# Patient Record
Sex: Female | Born: 1990 | Race: White | Hispanic: No | Marital: Single | Smoking: Former smoker
Health system: Southern US, Community
[De-identification: ages and names within clinical notes are randomized; demographics above are authoritative.]

## PROBLEM LIST (undated history)

## (undated) ENCOUNTER — Inpatient Hospital Stay (HOSPITAL_COMMUNITY): Payer: Self-pay

## (undated) DIAGNOSIS — F32A Depression, unspecified: Secondary | ICD-10-CM

## (undated) DIAGNOSIS — F419 Anxiety disorder, unspecified: Secondary | ICD-10-CM

## (undated) DIAGNOSIS — M549 Dorsalgia, unspecified: Secondary | ICD-10-CM

## (undated) DIAGNOSIS — R0602 Shortness of breath: Secondary | ICD-10-CM

## (undated) DIAGNOSIS — M2550 Pain in unspecified joint: Secondary | ICD-10-CM

## (undated) DIAGNOSIS — E282 Polycystic ovarian syndrome: Secondary | ICD-10-CM

## (undated) DIAGNOSIS — I10 Essential (primary) hypertension: Secondary | ICD-10-CM

## (undated) HISTORY — DX: Anxiety disorder, unspecified: F41.9

## (undated) HISTORY — DX: Pain in unspecified joint: M25.50

## (undated) HISTORY — DX: Dorsalgia, unspecified: M54.9

## (undated) HISTORY — DX: Polycystic ovarian syndrome: E28.2

## (undated) HISTORY — DX: Shortness of breath: R06.02

## (undated) HISTORY — DX: Essential (primary) hypertension: I10

## (undated) HISTORY — DX: Depression, unspecified: F32.A

---
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
## 2010-11-04 ENCOUNTER — Emergency Department (HOSPITAL_COMMUNITY)
Admission: EM | Admit: 2010-11-04 | Discharge: 2010-11-05 | Disposition: A | Discharge status: Home / Self Care | Payer: No Typology Code available for payment source | Attending: Emergency Medicine | Admitting: Emergency Medicine

## 2010-11-04 DIAGNOSIS — T1490XA Injury, unspecified, initial encounter: Secondary | ICD-10-CM | POA: Insufficient documentation

## 2010-11-04 DIAGNOSIS — M549 Dorsalgia, unspecified: Secondary | ICD-10-CM | POA: Insufficient documentation

## 2010-11-04 DIAGNOSIS — Y92410 Unspecified street and highway as the place of occurrence of the external cause: Secondary | ICD-10-CM | POA: Insufficient documentation

## 2011-02-18 ENCOUNTER — Ambulatory Visit: Payer: Self-pay

## 2011-02-18 DIAGNOSIS — J020 Streptococcal pharyngitis: Secondary | ICD-10-CM

## 2012-11-06 ENCOUNTER — Ambulatory Visit: Payer: Self-pay | Admitting: Emergency Medicine

## 2012-11-06 ENCOUNTER — Ambulatory Visit: Payer: Self-pay

## 2012-11-06 VITALS — BP 128/86 | HR 72 | Temp 98.1°F | Resp 18 | Ht 67.0 in | Wt 226.4 lb

## 2012-11-06 DIAGNOSIS — M25512 Pain in left shoulder: Secondary | ICD-10-CM

## 2012-11-06 DIAGNOSIS — M25519 Pain in unspecified shoulder: Secondary | ICD-10-CM

## 2012-11-06 MED ORDER — MELOXICAM 15 MG PO TABS: 15.0000 mg | ORAL_TABLET | Freq: Every day | ORAL | Status: DC

## 2012-11-06 MED ORDER — CYCLOBENZAPRINE HCL 5 MG PO TABS: 5.0000 mg | ORAL_TABLET | Freq: Three times a day (TID) | ORAL | Status: DC | PRN

## 2012-11-06 MED ORDER — TRAMADOL HCL 50 MG PO TABS: 50.0000 mg | ORAL_TABLET | Freq: Three times a day (TID) | ORAL | Status: DC | PRN

## 2012-11-06 NOTE — Progress Notes (Signed)
  Subjective:    Patient ID: Tami Swanson, female    DOB: 05/06/1990, 22 y.o.   MRN: [id number]  HPI  22 y.o. Female student  presents to clinic with right shoulder pain. Pain started yesterday morning. Denies any trouble with neck. Has had some tingling in arm and numbness every so often. Took ibuprofen for symptoms.  Notices pain when bending neck down to chest. Has some trouble when lifting arms. Has pain when turning neck to the left. LMP : 2 weeks ago  Review of Systems     Objective:   Physical Exam there is tenderness in the left parascapular area. There is pain with rotation and extension of the neck. Motor strength is 5 out of 5 all muscle groups. Rotator cuff testing is normal.  [facility] reading (PRIMARY) by  Dr.  Cleta Alberts C-spine shows straightening of the cervical spine shoulder films are normal        Assessment & Plan:  I think his symptoms are coming from her neck. We'll treat with meloxicam, Flexeril, and Ultram if needed at night.

## 2016-06-15 ENCOUNTER — Ambulatory Visit (INDEPENDENT_AMBULATORY_CARE_PROVIDER_SITE_OTHER): Payer: 59 | Admitting: Physician Assistant

## 2016-06-15 ENCOUNTER — Ambulatory Visit (INDEPENDENT_AMBULATORY_CARE_PROVIDER_SITE_OTHER): Payer: 59

## 2016-06-15 ENCOUNTER — Encounter: Payer: Self-pay | Admitting: Physician Assistant

## 2016-06-15 VITALS — BP 128/77 | HR 100 | Temp 97.6°F | Resp 17 | Ht 66.5 in | Wt 224.4 lb

## 2016-06-15 DIAGNOSIS — M545 Low back pain, unspecified: Secondary | ICD-10-CM

## 2016-06-15 DIAGNOSIS — M25511 Pain in right shoulder: Secondary | ICD-10-CM

## 2016-06-15 DIAGNOSIS — M25562 Pain in left knee: Secondary | ICD-10-CM | POA: Diagnosis not present

## 2016-06-15 DIAGNOSIS — Z87828 Personal history of other (healed) physical injury and trauma: Secondary | ICD-10-CM

## 2016-06-15 DIAGNOSIS — M25512 Pain in left shoulder: Secondary | ICD-10-CM

## 2016-06-15 DIAGNOSIS — M25561 Pain in right knee: Secondary | ICD-10-CM

## 2016-06-15 MED ORDER — CYCLOBENZAPRINE HCL 5 MG PO TABS: 5.0000 mg | ORAL_TABLET | Freq: Three times a day (TID) | ORAL | 1 refills | Status: DC | PRN

## 2016-06-15 MED ORDER — MELOXICAM 7.5 MG PO TABS: 7.5000 mg | ORAL_TABLET | Freq: Every day | ORAL | 0 refills | Status: DC

## 2016-06-15 MED ORDER — TRAMADOL HCL 50 MG PO TABS: 50.0000 mg | ORAL_TABLET | Freq: Three times a day (TID) | ORAL | 0 refills | Status: DC | PRN
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
## 2016-06-15 NOTE — Patient Instructions (Addendum)
Stay well hydrated.  Stay moving - try to walk daily. Keep moving!  Use heat and/or ice as needed for your back.  See below for stretches. - perform exercises below at 5 sets with 10 repetitions. Stretches are to be performed for 5 sets, 10 seconds each. perform this rehab twice daily within pain tolerance for 2 weeks.  Ultram is a controlled substance. Use this at night if you are having trouble sleeping.  Come back in 2-3 weeks if no improvement.    Low Back Strain Rehab Ask your health care provider which exercises are safe for you. Do exercises exactly as told by your health care provider and adjust them as directed. It is normal to feel mild stretching, pulling, tightness, or discomfort as you do these exercises, but you should stop right away if you feel sudden pain or your pain gets worse. Do not begin these exercises until told by your health care provider. Stretching and range of motion exercises These exercises warm up your muscles and joints and improve the movement and flexibility of your back. These exercises also help to relieve pain, numbness, and tingling. Exercise A: Single knee to chest   1. Lie on your back on a firm surface with both legs straight. 2. Bend one of your knees. Use your hands to move your knee up toward your chest until you feel a gentle stretch in your lower back and buttock.  Hold your leg in this position by holding onto the front of your knee.  Keep your other leg as straight as possible. 3. Hold for __________ seconds. 4. Slowly return to the starting position. 5. Repeat with your other leg. Repeat __________ times. Complete this exercise __________ times a day. Exercise B: Prone extension on elbows   1. Lie on your abdomen on a firm surface. 2. Prop yourself up on your elbows. 3. Use your arms to help lift your chest up until you feel a gentle stretch in your abdomen and your lower back.  This will place some of your body weight on your elbows.  If this is uncomfortable, try stacking pillows under your chest.  Your hips should stay down, against the surface that you are lying on. Keep your hip and back muscles relaxed. 4. Hold for __________ seconds. 5. Slowly relax your upper body and return to the starting position. Repeat __________ times. Complete this exercise __________ times a day. Strengthening exercises These exercises build strength and endurance in your back. Endurance is the ability to use your muscles for a long time, even after they get tired. Exercise C: Pelvic tilt  1. Lie on your back on a firm surface. Bend your knees and keep your feet flat. 2. Tense your abdominal muscles. Tip your pelvis up toward the ceiling and flatten your lower back into the floor.  To help with this exercise, you may place a small towel under your lower back and try to push your back into the towel. 3. Hold for __________ seconds. 4. Let your muscles relax completely before you repeat this exercise. Repeat __________ times. Complete this exercise __________ times a day. Exercise D: Alternating arm and leg raises   1. Get on your hands and knees on a firm surface. If you are on a hard floor, you may want to use padding to cushion your knees, such as an exercise mat. 2. Line up your arms and legs. Your hands should be below your shoulders, and your knees should be below your hips.  3. Lift your left leg behind you. At the same time, raise your right arm and straighten it in front of you.  Do not lift your leg higher than your hip.  Do not lift your arm higher than your shoulder.  Keep your abdominal and back muscles tight.  Keep your hips facing the ground.  Do not arch your back.  Keep your balance carefully, and do not hold your breath. 4. Hold for __________ seconds. 5. Slowly return to the starting position and repeat with your right leg and your left arm. Repeat __________ times. Complete this exercise __________times a  day. Exercise J: Single leg lower with bent knees  1. Lie on your back on a firm surface. 2. Tense your abdominal muscles and lift your feet off the floor, one foot at a time, so your knees and hips are bent in an "L" shape (at about 90 degrees).  Your knees should be over your hips and your lower legs should be parallel to the floor. 3. Keeping your abdominal muscles tense and your knee bent, slowly lower one of your legs so your toe touches the ground. 4. Lift your leg back up to return to the starting position.  Do not hold your breath.  Do not let your back arch. Keep your back flat against the ground. 5. Repeat with your other leg. Repeat __________ times. Complete this exercise __________ times a day. Posture and body mechanics   Body mechanics refers to the movements and positions of your body while you do your daily activities. Posture is part of body mechanics. Good posture and healthy body mechanics can help to relieve stress in your body's tissues and joints. Good posture means that your spine is in its natural S-curve position (your spine is neutral), your shoulders are pulled back slightly, and your head is not tipped forward. The following are general guidelines for applying improved posture and body mechanics to your everyday activities. Standing    When standing, keep your spine neutral and your feet about hip-width apart. Keep a slight bend in your knees. Your ears, shoulders, and hips should line up.  When you do a task in which you stand in one place for a long time, place one foot up on a stable object that is 2-4 inches (5-10 cm) high, such as a footstool. This helps keep your spine neutral. Sitting    When sitting, keep your spine neutral and keep your feet flat on the floor. Use a footrest, if necessary, and keep your thighs parallel to the floor. Avoid rounding your shoulders, and avoid tilting your head forward.  When working at a desk or a computer, keep your  desk at a height where your hands are slightly lower than your elbows. Slide your chair under your desk so you are close enough to maintain good posture.  When working at a computer, place your monitor at a height where you are looking straight ahead and you do not have to tilt your head forward or downward to look at the screen. Resting    When lying down and resting, avoid positions that are most painful for you.  If you have pain with activities such as sitting, bending, stooping, or squatting (flexion-based activities), lie in a position in which your body does not bend very much. For example, avoid curling up on your side with your arms and knees near your chest (fetal position).  If you have pain with activities such as standing for a  long time or reaching with your arms (extension-based activities), lie with your spine in a neutral position and bend your knees slightly. Try the following positions:  Lying on your side with a pillow between your knees.  Lying on your back with a pillow under your knees. Lifting    When lifting objects, keep your feet at least shoulder-width apart and tighten your abdominal muscles.  Bend your knees and hips and keep your spine neutral. It is important to lift using the strength of your legs, not your back. Do not lock your knees straight out.  Always ask for help to lift heavy or awkward objects. This information is not intended to replace advice given to you by your health care provider. Make sure you discuss any questions you have with your health care provider. Document Released: 02/11/2005 Document Revised: 10/19/2015 Document Reviewed: 11/23/2014 Elsevier Interactive Patient Education  2017 [employer].  IF you received an x-ray today, you will receive an invoice from [facility]. Please contact [facility] at [phone] with questions or concerns regarding your invoice.   IF you received labwork today, you will receive  an invoice from [facility]. Please contact [facility] at [phone] with questions or concerns regarding your invoice.   Our billing staff will not be able to assist you with questions regarding bills from these companies.  You will be contacted with the lab results as soon as they are available. The fastest way to get your results is to activate your My Chart account. Instructions are located on the last page of this paperwork. If you have not heard from [place] regarding the results in 2 weeks, please contact this office.

## 2017-06-18 ENCOUNTER — Encounter: Payer: Self-pay | Admitting: Physician Assistant

## 2017-06-18 ENCOUNTER — Ambulatory Visit (INDEPENDENT_AMBULATORY_CARE_PROVIDER_SITE_OTHER): Payer: 59 | Admitting: Physician Assistant

## 2017-06-18 ENCOUNTER — Other Ambulatory Visit: Payer: Self-pay

## 2017-06-18 VITALS — BP 124/76 | HR 75 | Temp 98.8°F | Resp 18 | Ht 66.5 in | Wt 234.2 lb

## 2017-06-18 DIAGNOSIS — Z130 Encounter for screening for diseases of the blood and blood-forming organs and certain disorders involving the immune mechanism: Secondary | ICD-10-CM

## 2017-06-18 DIAGNOSIS — Z1321 Encounter for screening for nutritional disorder: Secondary | ICD-10-CM

## 2017-06-18 DIAGNOSIS — Z114 Encounter for screening for human immunodeficiency virus [HIV]: Secondary | ICD-10-CM | POA: Diagnosis not present

## 2017-06-18 DIAGNOSIS — Z1329 Encounter for screening for other suspected endocrine disorder: Secondary | ICD-10-CM | POA: Diagnosis not present

## 2017-06-18 DIAGNOSIS — Z0000 Encounter for general adult medical examination without abnormal findings: Secondary | ICD-10-CM | POA: Diagnosis not present

## 2017-06-18 DIAGNOSIS — Z113 Encounter for screening for infections with a predominantly sexual mode of transmission: Secondary | ICD-10-CM | POA: Diagnosis not present

## 2017-06-18 DIAGNOSIS — Z13228 Encounter for screening for other metabolic disorders: Secondary | ICD-10-CM

## 2017-06-18 DIAGNOSIS — Z111 Encounter for screening for respiratory tuberculosis: Secondary | ICD-10-CM

## 2017-06-18 DIAGNOSIS — Z23 Encounter for immunization: Secondary | ICD-10-CM

## 2017-06-18 NOTE — Progress Notes (Signed)
06/18/2017 2:08 PM   DOB: Jul 18, 1990 / MRN: [id number]  SUBJECTIVE:  Tami MaineRachel Gierke is a 27 y.o. female presenting for annual exam.  She is a smoker. Had a pap last year and this followed by Gyn. She has no complaints today and feels well.  She would like me to sign some school forms for her.  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  No significant family history of CAD, cancer, stroke.   She has No Known Allergies.   She  has a past medical history of Hypertension.    She  reports that she has been smoking cigarettes.  She has been smoking about 0.25 packs per day. She has never used smokeless tobacco. She reports that she drinks alcohol. She reports that she does not use drugs. She  has no sexual activity history on file. The patient  has a past surgical history that includes Foot surgery (Right, 2006).  Her family history includes Heart disease in her maternal grandfather; Hypertension in her father.  Review of Systems  Constitutional: Negative for chills, diaphoresis and fever.  Eyes: Negative.   Respiratory: Negative for cough, hemoptysis, sputum production, shortness of breath and wheezing.   Cardiovascular: Negative for chest pain, orthopnea and leg swelling.  Gastrointestinal: Negative for abdominal pain, blood in stool, constipation, diarrhea, heartburn, melena, nausea and vomiting.  Genitourinary: Negative for dysuria, flank pain, frequency, hematuria and urgency.  Skin: Negative for rash.  Neurological: Negative for dizziness, sensory change, speech change, focal weakness and headaches.    The problem list and medications were reviewed and updated by myself where necessary and exist elsewhere in the encounter.   OBJECTIVE:  BP 124/76   Pulse 75   Temp 98.8 F (37.1 C) (Oral)   Resp 18   Ht 5' 6.5" (1.689 m)   Wt 234 lb 3.2 oz (106.2 kg)   LMP 06/02/2017   SpO2 97%   BMI 37.23 kg/m   Wt Readings from Last 3 Encounters:  06/18/17 234 lb 3.2 oz (106.2 kg)  06/15/16 224 lb 6 oz  (101.8 kg)  11/06/12 226 lb 6.4 oz (102.7 kg)   Physical Exam  Constitutional: She is oriented to person, place, and time. She appears well-nourished. No distress.  Eyes: Pupils are equal, round, and reactive to light. EOM are normal.  Cardiovascular: Normal rate, regular rhythm, S1 normal, S2 normal, normal heart sounds and intact distal pulses. Exam reveals no gallop, no friction rub and no decreased pulses.  No murmur heard. Pulmonary/Chest: Effort normal. No stridor. No respiratory distress. She has no wheezes. She has no rales.  Abdominal: Soft. Bowel sounds are normal. She exhibits no distension and no mass. There is no tenderness. There is no rebound and no guarding. No hernia.  Musculoskeletal: She exhibits no edema.  Neurological: She is alert and oriented to person, place, and time. No cranial nerve deficit. Gait normal.  Skin: Skin is dry. She is not diaphoretic.  Psychiatric: She has a normal mood and affect.  Vitals reviewed.   No results found for this or any previous visit (from the past 72 hour(s)).  No results found.  ASSESSMENT AND PLAN:  Fleet ContrasRachel was seen today for annual exam.  Diagnoses and all orders for this visit:  Annual physical exam  Screening for endocrine, nutritional, metabolic and immunity disorder -     CBC -     Hemoglobin A1c -     Basic metabolic panel -     Hepatic function  panel -     Lipid panel -     TSH -     Iron, TIBC and Ferritin Panel -     Urinalysis, dipstick only -     Varicella Zoster Abs, IgG/IgM  Screening for HIV (human immunodeficiency virus) -     HIV antibody  Screening examination for sexually transmitted disease -     GC/Chlamydia Probe Amp  Tuberculosis screening -     TB Skin Test -     Care order/instruction:  Need for vaccination for meningococcus -     MENINGOCOCCAL MCV4O    The patient is advised to call or return to clinic if she does not see an improvement in symptoms, or to seek the care of the  closest emergency department if she worsens with the above plan.   Deliah Boston, MHS, PA-C Primary Care at [facility] 06/18/2017 2:08 PM

## 2017-06-19 DIAGNOSIS — F17200 Nicotine dependence, unspecified, uncomplicated: Secondary | ICD-10-CM | POA: Insufficient documentation

## 2017-06-19 LAB — BASIC METABOLIC PANEL
BUN/Creatinine Ratio: 7 — ABNORMAL LOW (ref 9–23)
BUN: 5 mg/dL — ABNORMAL LOW (ref 6–20)
CALCIUM: 9.6 mg/dL (ref 8.7–10.2)
CHLORIDE: 101 mmol/L (ref 96–106)
CO2: 21 mmol/L (ref 20–29)
Creatinine, Ser: 0.69 mg/dL (ref 0.57–1.00)
GFR calc Af Amer: 137 mL/min/{1.73_m2} (ref >59)
GFR, EST NON AFRICAN AMERICAN: 119 mL/min/{1.73_m2} (ref >59)
GLUCOSE: 82 mg/dL (ref 65–99)
POTASSIUM: 4.4 mmol/L (ref 3.5–5.2)
SODIUM: 136 mmol/L (ref 134–144)

## 2017-06-19 LAB — TSH: TSH: 2.23 u[IU]/mL (ref 0.450–4.500)

## 2017-06-19 LAB — IRON,TIBC AND FERRITIN PANEL
Ferritin: 30 ng/mL (ref 15–150)
IRON SATURATION: 29 % (ref 15–55)
IRON: 85 ug/dL (ref 27–159)
Total Iron Binding Capacity: 295 ug/dL (ref 250–450)
UIBC: 210 ug/dL (ref 131–425)

## 2017-06-19 LAB — CBC
HEMOGLOBIN: 13.9 g/dL (ref 11.1–15.9)
Hematocrit: 40.1 % (ref 34.0–46.6)
MCH: 29.8 pg (ref 26.6–33.0)
MCHC: 34.7 g/dL (ref 31.5–35.7)
MCV: 86 fL (ref 79–97)
PLATELETS: 335 10*3/uL (ref 150–379)
RBC: 4.66 x10E6/uL (ref 3.77–5.28)
RDW: 13.6 % (ref 12.3–15.4)
WBC: 8 10*3/uL (ref 3.4–10.8)

## 2017-06-19 LAB — HEPATIC FUNCTION PANEL
ALBUMIN: 4.5 g/dL (ref 3.5–5.5)
ALT: 24 IU/L (ref 0–32)
AST: 26 IU/L (ref 0–40)
Alkaline Phosphatase: 92 IU/L (ref 39–117)
BILIRUBIN TOTAL: 0.2 mg/dL (ref 0.0–1.2)
Bilirubin, Direct: 0.07 mg/dL (ref 0.00–0.40)
TOTAL PROTEIN: 7.3 g/dL (ref 6.0–8.5)

## 2017-06-19 LAB — URINALYSIS, DIPSTICK ONLY
BILIRUBIN UA: NEGATIVE
GLUCOSE, UA: NEGATIVE
Ketones, UA: NEGATIVE
Leukocytes, UA: NEGATIVE
Nitrite, UA: NEGATIVE
PH UA: 6 (ref 5.0–7.5)
PROTEIN UA: NEGATIVE
RBC UA: NEGATIVE
SPEC GRAV UA: 1.005 (ref 1.005–1.030)
UUROB: 0.2 mg/dL (ref 0.2–1.0)

## 2017-06-19 LAB — VARICELLA ZOSTER ABS, IGG/IGM: VARICELLA: 2014 {index} (ref >165)

## 2017-06-19 LAB — HIV ANTIBODY (ROUTINE TESTING W REFLEX): HIV Screen 4th Generation wRfx: NONREACTIVE

## 2017-06-19 LAB — LIPID PANEL
CHOL/HDL RATIO: 3.3 ratio (ref 0.0–4.4)
Cholesterol, Total: 160 mg/dL (ref 100–199)
HDL: 49 mg/dL (ref >39)
LDL Calculated: 95 mg/dL (ref 0–99)
TRIGLYCERIDES: 80 mg/dL (ref 0–149)
VLDL CHOLESTEROL CAL: 16 mg/dL (ref 5–40)

## 2017-06-19 LAB — GC/CHLAMYDIA PROBE AMP
CHLAMYDIA, DNA PROBE: NEGATIVE
Neisseria gonorrhoeae by PCR: NEGATIVE

## 2017-06-19 LAB — HEMOGLOBIN A1C
ESTIMATED AVERAGE GLUCOSE: 103 mg/dL
HEMOGLOBIN A1C: 5.2 % (ref 4.8–5.6)

## 2017-11-06 DIAGNOSIS — Z6838 Body mass index (BMI) 38.0-38.9, adult: Secondary | ICD-10-CM | POA: Diagnosis not present

## 2017-11-06 DIAGNOSIS — Z3201 Encounter for pregnancy test, result positive: Secondary | ICD-10-CM | POA: Diagnosis not present

## 2017-11-06 DIAGNOSIS — Z3480 Encounter for supervision of other normal pregnancy, unspecified trimester: Secondary | ICD-10-CM | POA: Diagnosis not present

## 2017-11-06 DIAGNOSIS — Z113 Encounter for screening for infections with a predominantly sexual mode of transmission: Secondary | ICD-10-CM | POA: Diagnosis not present

## 2017-11-06 DIAGNOSIS — N925 Other specified irregular menstruation: Secondary | ICD-10-CM | POA: Diagnosis not present

## 2017-11-06 DIAGNOSIS — Z01419 Encounter for gynecological examination (general) (routine) without abnormal findings: Secondary | ICD-10-CM | POA: Diagnosis not present

## 2017-11-06 DIAGNOSIS — Z124 Encounter for screening for malignant neoplasm of cervix: Secondary | ICD-10-CM | POA: Diagnosis not present

## 2017-11-06 LAB — OB RESULTS CONSOLE GC/CHLAMYDIA
CHLAMYDIA, DNA PROBE: NEGATIVE
Gonorrhea: NEGATIVE

## 2017-11-27 NOTE — Progress Notes (Signed)
Screening for malignant neoplasm of cervix IGP, CTING , RFX, APTIMA, HPV, if  ASCUS add GC/CT testing

## 2017-12-03 ENCOUNTER — Encounter: Payer: Self-pay | Admitting: Obstetrics and Gynecology

## 2017-12-03 ENCOUNTER — Encounter: Payer: Self-pay | Admitting: Radiology

## 2017-12-03 DIAGNOSIS — Z8619 Personal history of other infectious and parasitic diseases: Secondary | ICD-10-CM | POA: Insufficient documentation

## 2017-12-03 DIAGNOSIS — Z720 Tobacco use: Secondary | ICD-10-CM | POA: Insufficient documentation

## 2017-12-03 DIAGNOSIS — A630 Anogenital (venereal) warts: Secondary | ICD-10-CM | POA: Insufficient documentation

## 2017-12-03 DIAGNOSIS — O0990 Supervision of high risk pregnancy, unspecified, unspecified trimester: Secondary | ICD-10-CM | POA: Insufficient documentation

## 2017-12-03 DIAGNOSIS — L680 Hirsutism: Secondary | ICD-10-CM | POA: Insufficient documentation

## 2017-12-03 DIAGNOSIS — O98319 Other infections with a predominantly sexual mode of transmission complicating pregnancy, unspecified trimester: Secondary | ICD-10-CM

## 2017-12-04 ENCOUNTER — Ambulatory Visit (INDEPENDENT_AMBULATORY_CARE_PROVIDER_SITE_OTHER): Payer: BLUE CROSS/BLUE SHIELD | Admitting: Obstetrics and Gynecology

## 2017-12-04 ENCOUNTER — Encounter: Payer: Self-pay | Admitting: Obstetrics and Gynecology

## 2017-12-04 DIAGNOSIS — Z3401 Encounter for supervision of normal first pregnancy, first trimester: Secondary | ICD-10-CM | POA: Diagnosis not present

## 2017-12-04 DIAGNOSIS — O99211 Obesity complicating pregnancy, first trimester: Secondary | ICD-10-CM

## 2017-12-04 DIAGNOSIS — L680 Hirsutism: Secondary | ICD-10-CM

## 2017-12-04 DIAGNOSIS — Z8619 Personal history of other infectious and parasitic diseases: Secondary | ICD-10-CM

## 2017-12-04 DIAGNOSIS — O0990 Supervision of high risk pregnancy, unspecified, unspecified trimester: Secondary | ICD-10-CM

## 2017-12-04 DIAGNOSIS — O99210 Obesity complicating pregnancy, unspecified trimester: Secondary | ICD-10-CM

## 2017-12-04 DIAGNOSIS — Z720 Tobacco use: Secondary | ICD-10-CM

## 2017-12-04 DIAGNOSIS — I10 Essential (primary) hypertension: Secondary | ICD-10-CM

## 2017-12-04 DIAGNOSIS — E669 Obesity, unspecified: Secondary | ICD-10-CM | POA: Insufficient documentation

## 2017-12-04 DIAGNOSIS — O10919 Unspecified pre-existing hypertension complicating pregnancy, unspecified trimester: Secondary | ICD-10-CM | POA: Insufficient documentation

## 2017-12-04 MED ORDER — ASPIRIN EC 81 MG PO TBEC: 81.0000 mg | DELAYED_RELEASE_TABLET | Freq: Every day | ORAL | 10 refills | Status: DC

## 2017-12-04 NOTE — Patient Instructions (Signed)
Aim for an end pregnancy weight in the mid 260 lbs range.  Start the low dose aspirin today

## 2017-12-04 NOTE — Progress Notes (Signed)
Prenatal Visit Note Date: 12/04/2017 Clinic: [facility]  Transfer of care visit from [facility] due to them not taking type of medicaid  Subjective:  Tami Swanson is a 27 y.o. G1P0 at [redacted]w[redacted]d being seen today for ongoing prenatal care.  She is currently monitored for the following issues for this high-risk pregnancy and has Tobacco dependence syndrome; Condyloma acuminata of vulva in pregnancy, unspecified trimester; History of genital warts; Hirsutism; Tobacco abuse; Encounter for supervision of normal first pregnancy in first trimester; Chronic hypertension; Obesity (BMI 30-39.9); and Obesity in pregnancy on their problem list.  Patient reports no complaints.   Contractions: Not present. Vag. Bleeding: None.  Movement: Absent. Denies leaking of fluid.   The following portions of the patient's history were reviewed and updated as appropriate: allergies, current medications, past family history, past medical history, past social history, past surgical history and problem list. Problem list updated.  Objective:   Vitals:   12/04/17 1404  BP: 124/79  Pulse: 71  Weight: 248 lb (112.5 kg)    Fetal Status: Fetal Heart Rate (bpm): 155   Movement: Absent     General:  Alert, oriented and cooperative. Patient is in no acute distress.  Skin: Skin is warm and dry. No rash noted.   Cardiovascular: Normal heart rate noted  Respiratory: Normal respiratory effort, no problems with respiration noted  Abdomen: Soft, gravid, appropriate for gestational age. Pain/Pressure: Absent     Pelvic:  Cervical exam deferred        Extremities: Normal range of motion.     Mental Status: Normal mood and affect. Normal behavior. Normal judgment and thought content.   Urinalysis:      Assessment and Plan:  Pregnancy: G1P0 at [redacted]w[redacted]d  1. History of genital warts  2. Hirsutism  3. Tobacco abuse Quit when she found out  4. Encounter for supervision of normal first pregnancy in first  trimester Stopped her OCPs last year. Routine care. Desires genetics. Offer afp nv - Obstetric Panel, Including HIV - Urine Culture - [place] MFM OB DETAIL +14 WK; Future - TSH - CMP and Liver - Protein / creatinine ratio, urine - Genetic Screening  5. Chronic hypertension On no meds. Last was on spironolactone last year. Baseline pre-eclampsia labs today. D/w her re: serial growth u/s, 32wks qwk testing and 39wk delivery. Pt amenable to starting low dose asa today - Obstetric Panel, Including HIV - Urine Culture - [place] MFM OB DETAIL +14 WK; Future - TSH - CMP and Liver - Protein / creatinine ratio, urine - Hemoglobin A1c  6. Obesity (BMI 30-39.9) Was 247lbs at her 9/12 [facility]N visit.  Goal of end pregnancy weight in the mid 260s  7. Obesity in pregnancy  Preterm labor symptoms and general obstetric precautions including but not limited to vaginal bleeding, contractions, leaking of fluid and fetal movement were reviewed in detail with the patient. Please refer to After Visit Summary for other counseling recommendations.  Return in about 3 weeks (around 12/25/2017) for hrob.   Loughman Bing, MD

## 2017-12-05 LAB — OBSTETRIC PANEL, INCLUDING HIV
Antibody Screen: NEGATIVE
Basophils Absolute: 0.1 10*3/uL (ref 0.0–0.2)
Basos: 0 %
EOS (ABSOLUTE): 0.2 10*3/uL (ref 0.0–0.4)
EOS: 2 %
HEMOGLOBIN: 13.2 g/dL (ref 11.1–15.9)
HEP B S AG: NEGATIVE
HIV Screen 4th Generation wRfx: NONREACTIVE
Hematocrit: 37.9 % (ref 34.0–46.6)
IMMATURE GRANS (ABS): 0 10*3/uL (ref 0.0–0.1)
IMMATURE GRANULOCYTES: 0 %
LYMPHS ABS: 2.7 10*3/uL (ref 0.7–3.1)
LYMPHS: 24 %
MCH: 29.5 pg (ref 26.6–33.0)
MCHC: 34.8 g/dL (ref 31.5–35.7)
MCV: 85 fL (ref 79–97)
MONOCYTES: 7 %
Monocytes Absolute: 0.7 10*3/uL (ref 0.1–0.9)
NEUTROS PCT: 67 %
Neutrophils Absolute: 7.5 10*3/uL — ABNORMAL HIGH (ref 1.4–7.0)
Platelets: 317 10*3/uL (ref 150–450)
RBC: 4.47 x10E6/uL (ref 3.77–5.28)
RDW: 12.8 % (ref 12.3–15.4)
RH TYPE: POSITIVE
RPR: NONREACTIVE
RUBELLA: 6.69 {index} (ref >0.99)
WBC: 11.3 10*3/uL — ABNORMAL HIGH (ref 3.4–10.8)

## 2017-12-05 LAB — CMP AND LIVER
ALBUMIN: 3.8 g/dL (ref 3.5–5.5)
ALK PHOS: 65 IU/L (ref 39–117)
ALT: 14 IU/L (ref 0–32)
AST: 13 IU/L (ref 0–40)
BILIRUBIN, DIRECT: 0.06 mg/dL (ref 0.00–0.40)
BUN: 4 mg/dL — ABNORMAL LOW (ref 6–20)
Bilirubin Total: <0.2 mg/dL (ref 0.0–1.2)
CHLORIDE: 100 mmol/L (ref 96–106)
CO2: 20 mmol/L (ref 20–29)
Calcium: 9.3 mg/dL (ref 8.7–10.2)
Creatinine, Ser: 0.54 mg/dL — ABNORMAL LOW (ref 0.57–1.00)
GFR calc Af Amer: 149 mL/min/{1.73_m2} (ref >59)
GFR calc non Af Amer: 129 mL/min/{1.73_m2} (ref >59)
Glucose: 69 mg/dL (ref 65–99)
Potassium: 4.2 mmol/L (ref 3.5–5.2)
SODIUM: 136 mmol/L (ref 134–144)
TOTAL PROTEIN: 6.5 g/dL (ref 6.0–8.5)

## 2017-12-05 LAB — PROTEIN / CREATININE RATIO, URINE
CREATININE, UR: 50.1 mg/dL
PROTEIN UR: 6.4 mg/dL
Protein/Creat Ratio: 128 mg/g creat (ref 0–200)

## 2017-12-05 LAB — HEMOGLOBIN A1C
Est. average glucose Bld gHb Est-mCnc: 105 mg/dL
Hgb A1c MFr Bld: 5.3 % (ref 4.8–5.6)

## 2017-12-05 LAB — TSH: TSH: 1.46 u[IU]/mL (ref 0.450–4.500)

## 2017-12-06 LAB — URINE CULTURE: ORGANISM ID, BACTERIA: NO GROWTH

## 2017-12-16 ENCOUNTER — Telehealth: Payer: Self-pay

## 2017-12-16 NOTE — Telephone Encounter (Signed)
Called patient to inform her of panorama test results.Left a message for patient to call her back.

## 2017-12-17 ENCOUNTER — Telehealth: Payer: Self-pay

## 2017-12-17 DIAGNOSIS — R899 Unspecified abnormal finding in specimens from other organs, systems and tissues: Secondary | ICD-10-CM

## 2017-12-17 NOTE — Telephone Encounter (Signed)
Due to patient fetal fraction results patient will need to be referred to generic counseling for further work up.

## 2017-12-22 ENCOUNTER — Encounter: Payer: Self-pay | Admitting: *Deleted

## 2017-12-25 ENCOUNTER — Ambulatory Visit (INDEPENDENT_AMBULATORY_CARE_PROVIDER_SITE_OTHER): Payer: BLUE CROSS/BLUE SHIELD | Admitting: Obstetrics & Gynecology

## 2017-12-25 ENCOUNTER — Encounter: Payer: Self-pay | Admitting: Obstetrics & Gynecology

## 2017-12-25 VITALS — BP 135/89 | HR 81 | Wt 249.0 lb

## 2017-12-25 DIAGNOSIS — O10919 Unspecified pre-existing hypertension complicating pregnancy, unspecified trimester: Secondary | ICD-10-CM

## 2017-12-25 DIAGNOSIS — O285 Abnormal chromosomal and genetic finding on antenatal screening of mother: Secondary | ICD-10-CM | POA: Diagnosis not present

## 2017-12-25 DIAGNOSIS — O0990 Supervision of high risk pregnancy, unspecified, unspecified trimester: Secondary | ICD-10-CM

## 2017-12-25 NOTE — Patient Instructions (Signed)
Return to clinic for any scheduled appointments or obstetric concerns, or go to [facility] for evaluation  

## 2017-12-25 NOTE — Progress Notes (Signed)
   PRENATAL VISIT NOTE  Subjective:  Tami Swanson is a 27 y.o. G1P0 at [redacted]w[redacted]d being seen today for ongoing prenatal care.  She is currently monitored for the following issues for this low-risk pregnancy and has Tobacco dependence syndrome; Condyloma acuminata of vulva in pregnancy, unspecified trimester; History of genital warts; Hirsutism; Tobacco abuse; Supervision of high risk pregnancy, antepartum; Chronic hypertension complicating pregnancy, antepartum; Obesity (BMI 30-39.9); and Obesity in pregnancy on their problem list.  Patient reports no complaints.  Contractions: Not present. Vag. Bleeding: None.  Movement: Absent. Denies leaking of fluid.   The following portions of the patient's history were reviewed and updated as appropriate: allergies, current medications, past family history, past medical history, past social history, past surgical history and problem list. Problem list updated.  Objective:   Vitals:   12/25/17 1310  BP: 135/89  Pulse: 81  Weight: 249 lb (112.9 kg)    Fetal Status: Fetal Heart Rate (bpm): 155   Movement: Absent     General:  Alert, oriented and cooperative. Patient is in no acute distress.  Skin: Skin is warm and dry. No rash noted.   Cardiovascular: Normal heart rate noted  Respiratory: Normal respiratory effort, no problems with respiration noted  Abdomen: Soft, gravid, appropriate for gestational age.  Pain/Pressure: Absent     Pelvic: Cervical exam deferred        Extremities: Normal range of motion.  Edema: Trace  Mental Status: Normal mood and affect. Normal behavior. Normal judgment and thought content.    Assessment and Plan:  Pregnancy: G1P0 at [redacted]w[redacted]d  1. Abnormal chromosomal and genetic finding on antenatal screening mother Panorama done at 12 weeks showed low fetal fraction (1.1%), this was reported as high risk for T13 and T18.  Redraw recommended.  Will also check quad screen. Offered genetic counseling, MFM referral for amniocentesis,  but she declined for now.  Will follow up results and manage accordingly. - AFP TETRA - Genetic Screening  2. Chronic hypertension complicating pregnancy, antepartum Stable BP, encouraged to start ASA as prescribed for preeclampsia prevention.  3. Supervision of high risk pregnancy, antepartum Anatomy scan already scheduled. No other complaints or concerns.  Routine obstetric precautions reviewed.  Please refer to After Visit Summary for other counseling recommendations.  Return in about 4 weeks (around 01/22/2018) for OB Visit.  Future Appointments  Date Time Provider Department Center  01/15/2018 11:00 AM [facility] [place] 3 [facility]    Jaynie Collins, MD

## 2017-12-27 LAB — AFP TETRA
DIA MOM VALUE: 1.73
DIA VALUE (EIA): 224.53 pg/mL
DSR (By Age)    1 IN: 749
DSR (Second Trimester) 1 IN: 661
GESTATIONAL AGE AFP: 15.6 wk
MATERNAL AGE AT EDD: 29.4 a
MSAFP Mom: 0.93
MSAFP: 21.5 ng/mL
MSHCG Mom: 1.97
MSHCG: 67686 m[IU]/mL
OSB RISK: 10000
T18 (By Age): 1:2917 {titer}
Test Results:: NEGATIVE
UE3 MOM: 0.99
UE3 VALUE: 0.71 ng/mL
Weight: 249 [lb_av]

## 2018-01-05 ENCOUNTER — Encounter: Payer: Self-pay | Admitting: *Deleted

## 2018-01-05 ENCOUNTER — Telehealth: Payer: Self-pay | Admitting: *Deleted

## 2018-01-05 NOTE — Telephone Encounter (Signed)
Left message for patient to call in regards to her panorama results.  [name], RN

## 2018-01-07 ENCOUNTER — Encounter (HOSPITAL_COMMUNITY): Payer: Self-pay

## 2018-01-15 ENCOUNTER — Other Ambulatory Visit (HOSPITAL_COMMUNITY): Payer: Self-pay | Admitting: *Deleted

## 2018-01-15 ENCOUNTER — Encounter (HOSPITAL_COMMUNITY): Payer: Self-pay

## 2018-01-15 ENCOUNTER — Ambulatory Visit (HOSPITAL_COMMUNITY)
Admission: RE | Admit: 2018-01-15 | Discharge: 2018-01-15 | Disposition: A | Discharge status: Home / Self Care | Payer: BLUE CROSS/BLUE SHIELD | Source: Ambulatory Visit | Attending: Obstetrics and Gynecology | Admitting: Obstetrics and Gynecology

## 2018-01-15 DIAGNOSIS — O99332 Smoking (tobacco) complicating pregnancy, second trimester: Secondary | ICD-10-CM | POA: Diagnosis not present

## 2018-01-15 DIAGNOSIS — Z363 Encounter for antenatal screening for malformations: Secondary | ICD-10-CM | POA: Diagnosis not present

## 2018-01-15 DIAGNOSIS — O10012 Pre-existing essential hypertension complicating pregnancy, second trimester: Secondary | ICD-10-CM | POA: Diagnosis not present

## 2018-01-15 DIAGNOSIS — Z362 Encounter for other antenatal screening follow-up: Secondary | ICD-10-CM

## 2018-01-15 DIAGNOSIS — Z3401 Encounter for supervision of normal first pregnancy, first trimester: Secondary | ICD-10-CM

## 2018-01-15 DIAGNOSIS — Z3A18 18 weeks gestation of pregnancy: Secondary | ICD-10-CM

## 2018-01-15 DIAGNOSIS — I10 Essential (primary) hypertension: Secondary | ICD-10-CM

## 2018-01-29 ENCOUNTER — Ambulatory Visit (INDEPENDENT_AMBULATORY_CARE_PROVIDER_SITE_OTHER): Payer: BLUE CROSS/BLUE SHIELD | Admitting: Obstetrics & Gynecology

## 2018-01-29 ENCOUNTER — Encounter: Payer: Self-pay | Admitting: Obstetrics & Gynecology

## 2018-01-29 VITALS — BP 126/79 | HR 83 | Wt 248.0 lb

## 2018-01-29 DIAGNOSIS — O0990 Supervision of high risk pregnancy, unspecified, unspecified trimester: Secondary | ICD-10-CM

## 2018-01-29 DIAGNOSIS — O10919 Unspecified pre-existing hypertension complicating pregnancy, unspecified trimester: Secondary | ICD-10-CM

## 2018-01-29 DIAGNOSIS — Z3A20 20 weeks gestation of pregnancy: Secondary | ICD-10-CM

## 2018-01-29 DIAGNOSIS — O10912 Unspecified pre-existing hypertension complicating pregnancy, second trimester: Secondary | ICD-10-CM

## 2018-01-29 DIAGNOSIS — O0992 Supervision of high risk pregnancy, unspecified, second trimester: Secondary | ICD-10-CM

## 2018-01-29 NOTE — Patient Instructions (Signed)
Return to clinic for any scheduled appointments or obstetric concerns, or go to [facility] for evaluation  

## 2018-01-29 NOTE — Progress Notes (Signed)
PRENATAL VISIT NOTE  Subjective:  Tami Swanson is a 27 y.o. G1P0 at [redacted]w[redacted]d being seen today for ongoing prenatal care.  She is currently monitored for the following issues for this high-risk pregnancy and has Tobacco dependence syndrome; Condyloma acuminata of vulva in pregnancy, unspecified trimester; History of genital warts; Hirsutism; Supervision of high risk pregnancy, antepartum; Chronic hypertension complicating pregnancy, antepartum; Obesity (BMI 30-39.9); and Obesity in pregnancy on their problem list.  Patient reports mild bilateral edema in feet and ankles.  Contractions: Not present. Vag. Bleeding: None.  Movement: Present. Denies leaking of fluid.   The following portions of the patient's history were reviewed and updated as appropriate: allergies, current medications, past family history, past medical history, past social history, past surgical history and problem list. Problem list updated.  Objective:   Vitals:   01/29/18 1323  BP: 126/79  Pulse: 83  Weight: 248 lb (112.5 kg)    Fetal Status: Fetal Heart Rate (bpm): 147   Movement: Present     General:  Alert, oriented and cooperative. Patient is in no acute distress.  Skin: Skin is warm and dry. No rash noted.   Cardiovascular: Normal heart rate noted  Respiratory: Normal respiratory effort, no problems with respiration noted  Abdomen: Soft, gravid, appropriate for gestational age.  Pain/Pressure: Absent     Pelvic: Cervical exam deferred        Extremities: Normal range of motion.  Edema: Trace  Mental Status: Normal mood and affect. Normal behavior. Normal judgment and thought content.   [place] Mfm Ob Detail +14 Wk  Result Date: 01/15/2018 ----------------------------------------------------------------------  OBSTETRICS REPORT                       (Signed Final 01/15/2018 01:15 pm) ---------------------------------------------------------------------- Patient Info  ID #:       [id number]                           D.O.B.:  1990/10/28 (27 yrs)  Name:       Tami Swanson                     Visit Date: 01/15/2018 11:10 am ---------------------------------------------------------------------- Performed By  Performed By:     Vivien Rota        Ref. Address:     [address]                    [address]                                                             [address]  Attending:        Patsi Sears      Location:         [facility]                    MD  Referred By:      [facility] for                    [facility] ---------------------------------------------------------------------- Orders   #  Description                          Code         Ordered By   1  US MFM OB DETAIL +14 WK              [id number]     CHARLIE PICKENS  ----------------------------------------------------------------------   #  Order #                    Accession #                 Episode #   1  [id number]                  [id number]                  [id number]  ---------------------------------------------------------------------- Indications   Encounter for antenatal screening for          Z36.3   malformations   Hypertension - Chronic/Pre-existing            O10.019   Smoking complicating pregnancy, second         O99.332   trimester   [redacted] weeks gestation of pregnancy                Z3A.18  ---------------------------------------------------------------------- Fetal Evaluation  Num Of Fetuses:         1  Fetal Heart Rate(bpm):  143  Cardiac Activity:       Observed  Presentation:           Cephalic  Placenta:               Posterior  P. Cord Insertion:      Visualized  Amniotic Fluid  AFI FV:      Within normal limits                              Largest Pocket(cm)                              4.84 ---------------------------------------------------------------------- Biometry  BPD:      45.8  mm     G. Age:  19w 6d         93  %    CI:        76.91   %    70 - 86                                                           FL/HC:      17.1   %    16.1 - 18.3  HC:      165.4  mm     G. Age:  19w 2d         75  %    HC/AC:      1.16        1.09 - 1.39  AC:       142   mm     G. Age:  19w 4d         78  %  FL/BPD:     61.8   %  FL:       28.3  mm     G. Age:  18w 5d         48  %    FL/AC:      19.9   %    20 - 24  HUM:      28.6  mm     G. Age:  19w 2d         71  %  CER:      18.3  mm     G. Age:  18w 1d         35  %  NFT:       4.3  mm  CM:        2.7  mm  Est. FW:     278  gm    0 lb 10 oz      56  % ---------------------------------------------------------------------- OB History  Gravidity:    1 ---------------------------------------------------------------------- Gestational Age  LMP:           18w 4d        Date:  09/07/17                 EDD:   06/14/18  U/S Today:     19w 3d                                        EDD:   06/08/18  Best:          18w 4d     Det. By:  LMP  (09/07/17)          EDD:   06/14/18 ---------------------------------------------------------------------- Anatomy  Cranium:               Appears normal         Aortic Arch:            Appears normal  Cavum:                 Not well visualized    Ductal Arch:            Appears normal  Ventricles:            Not well visualized    Diaphragm:              Appears normal  Choroid Plexus:        Appears normal         Stomach:                Appears normal, left                                                                        sided  Cerebellum:            Appears normal         Abdomen:                Appears normal  Posterior Fossa:       Appears normal         Abdominal  Wall:         Appears nml (cord                                                                        insert, abd wall)  Nuchal Fold:           Appears normal         Cord Vessels:           Appears normal (3                                                                        vessel cord)  Face:                  Appears normal         Kidneys:                 Appear normal                         (orbits and profile)  Lips:                  Appears normal         Bladder:                Appears normal  Thoracic:              Appears normal         Spine:                  Appears normal  Heart:                 Not well visualized    Upper Extremities:      Appears normal  RVOT:                  Not well visualized    Lower Extremities:      Appears normal  LVOT:                  Appears normal  Other:  Parents do not wish to know sex of fetus. Right heel visualized.          Technically difficult due to maternal habitus and fetal position. Lenses          visualized. Midline falx visualized. Female gender. ---------------------------------------------------------------------- Cervix Uterus Adnexa  Cervix  Length:           3.29  cm.  Normal appearance by transabdominal scan.  Uterus  No abnormality visualized.  Left Ovary  Within normal limits.  Right Ovary  Not visualized.  Adnexa  No abnormality visualized. No adnexal mass  visualized. ---------------------------------------------------------------------- Myomas   Site                     L(cm)      W(cm)      D(cm)  Location   Anterior                 1.5        1.6        1  ----------------------------------------------------------------------   Blood Flow                 RI        PI       Comments  ---------------------------------------------------------------------- Comments  U/S images reviewed. Findings reviewed with patient.  Appropriate fetal growth is noted.   No fetal abnormalities are  seen.  Patient has had 2 Panorama NIPT test with insufficient fetal  fraction.  She was offered Genetic/MFM counseling but  declined.  Questions answered.  BP - 138/90.  10 minutes spent face to face with patient.  Recommendations: 1) Completion of anatomy in 4 weeks 2)  Serial U/S every 4 weeks for fetal growth 3) Weekly BPP in  the third trimester ----------------------------------------------------------------------  Recommendations  1) Completion of anatomy in 4 weeks 2) Serial U/S every 4  weeks for fetal growth 3) Weekly BPP in the third trimester ----------------------------------------------------------------------               Patsi Sears, MD Electronically Signed Final Report   01/15/2018 01:15 pm ----------------------------------------------------------------------   Assessment and Plan:  Pregnancy: G1P0 at [redacted]w[redacted]d  1. Chronic hypertension complicating pregnancy, antepartum Stable BP, on ASA.  2. Supervision of high risk pregnancy, antepartum Follow up scan ordered already. Recommended compression stockings for edema, and advised her to call/return for any worsening symptoms. Preterm labor symptoms and general obstetric precautions including but not limited to vaginal bleeding, contractions, leaking of fluid and fetal movement were reviewed in detail with the patient. Please refer to After Visit Summary for other counseling recommendations.  Return in about 4 weeks (around 02/26/2018) for OB Visit.  Future Appointments  Date Time Provider Department Center  02/11/2018 11:15 AM [facility] [place] 4 WH-[facility]-US    Jaynie Collins, MD

## 2018-02-11 ENCOUNTER — Other Ambulatory Visit (HOSPITAL_COMMUNITY): Payer: Self-pay | Admitting: *Deleted

## 2018-02-11 ENCOUNTER — Ambulatory Visit (HOSPITAL_COMMUNITY)
Admission: RE | Admit: 2018-02-11 | Discharge: 2018-02-11 | Disposition: A | Discharge status: Home / Self Care | Payer: BLUE CROSS/BLUE SHIELD | Source: Ambulatory Visit | Attending: Obstetrics and Gynecology | Admitting: Obstetrics and Gynecology

## 2018-02-11 ENCOUNTER — Encounter (HOSPITAL_COMMUNITY): Payer: Self-pay

## 2018-02-11 DIAGNOSIS — O99212 Obesity complicating pregnancy, second trimester: Secondary | ICD-10-CM | POA: Insufficient documentation

## 2018-02-11 DIAGNOSIS — O10012 Pre-existing essential hypertension complicating pregnancy, second trimester: Secondary | ICD-10-CM | POA: Diagnosis not present

## 2018-02-11 DIAGNOSIS — Z362 Encounter for other antenatal screening follow-up: Secondary | ICD-10-CM | POA: Insufficient documentation

## 2018-02-11 DIAGNOSIS — O99332 Smoking (tobacco) complicating pregnancy, second trimester: Secondary | ICD-10-CM | POA: Diagnosis not present

## 2018-02-11 DIAGNOSIS — Z3A22 22 weeks gestation of pregnancy: Secondary | ICD-10-CM | POA: Insufficient documentation

## 2018-02-11 DIAGNOSIS — O10919 Unspecified pre-existing hypertension complicating pregnancy, unspecified trimester: Secondary | ICD-10-CM

## 2018-02-26 ENCOUNTER — Ambulatory Visit (INDEPENDENT_AMBULATORY_CARE_PROVIDER_SITE_OTHER): Payer: BLUE CROSS/BLUE SHIELD | Admitting: Obstetrics and Gynecology

## 2018-02-26 ENCOUNTER — Encounter: Payer: Self-pay | Admitting: Obstetrics and Gynecology

## 2018-02-26 VITALS — BP 128/78 | HR 89 | Wt 251.0 lb

## 2018-02-26 DIAGNOSIS — F17200 Nicotine dependence, unspecified, uncomplicated: Secondary | ICD-10-CM

## 2018-02-26 DIAGNOSIS — O10912 Unspecified pre-existing hypertension complicating pregnancy, second trimester: Secondary | ICD-10-CM

## 2018-02-26 DIAGNOSIS — O99210 Obesity complicating pregnancy, unspecified trimester: Secondary | ICD-10-CM

## 2018-02-26 DIAGNOSIS — O99212 Obesity complicating pregnancy, second trimester: Secondary | ICD-10-CM

## 2018-02-26 DIAGNOSIS — O10919 Unspecified pre-existing hypertension complicating pregnancy, unspecified trimester: Secondary | ICD-10-CM

## 2018-02-26 NOTE — Progress Notes (Signed)
Prenatal Visit Note Date: 02/26/2018 Clinic: [facility]  Subjective:  Tami Swanson is a 28 y.o. G1P0 at [redacted]w[redacted]d being seen today for ongoing prenatal care.  She is currently monitored for the following issues for this high-risk pregnancy and has Tobacco dependence syndrome; Condyloma acuminata of vulva in pregnancy, unspecified trimester; History of genital warts; Hirsutism; Supervision of high risk pregnancy, antepartum; Chronic hypertension complicating pregnancy, antepartum; Obesity (BMI 30-39.9); and Obesity in pregnancy on their problem list.  Patient reports no complaints.   Contractions: Not present. Vag. Bleeding: None.  Movement: Present. Denies leaking of fluid.   The following portions of the patient's history were reviewed and updated as appropriate: allergies, current medications, past family history, past medical history, past social history, past surgical history and problem list. Problem list updated.  Objective:   Vitals:   02/26/18 1311  BP: 128/78  Pulse: 89  Weight: 251 lb (113.9 kg)    Fetal Status: Fetal Heart Rate (bpm): 150   Movement: Present     General:  Alert, oriented and cooperative. Patient is in no acute distress.  Skin: Skin is warm and dry. No rash noted.   Cardiovascular: Normal heart rate noted  Respiratory: Normal respiratory effort, no problems with respiration noted  Abdomen: Soft, gravid, appropriate for gestational age. Pain/Pressure: Absent     Pelvic:  Cervical exam deferred        Extremities: Normal range of motion.  Edema: None  Mental Status: Normal mood and affect. Normal behavior. Normal judgment and thought content.   Urinalysis:      Assessment and Plan:  Pregnancy: G1P0 at [redacted]w[redacted]d  1. Chronic hypertension complicating pregnancy, antepartum Doing well on just low dose asa. Growth normal and scheduled for rpt in two weeks. Start weekly testing at 32wks  2. Obesity in pregnancy Weight good  3. History of  tobacco use Quit when found out was pregnant  Preterm labor symptoms and general obstetric precautions including but not limited to vaginal bleeding, contractions, leaking of fluid and fetal movement were reviewed in detail with the patient. Please refer to After Visit Summary for other counseling recommendations.  Return in about 2 weeks (around 03/12/2018) for 2-3wks rob, 2hr GTT.   [place] Bing, MD

## 2018-03-09 NOTE — Progress Notes (Signed)
tdap  28 weeks

## 2018-03-10 ENCOUNTER — Ambulatory Visit (INDEPENDENT_AMBULATORY_CARE_PROVIDER_SITE_OTHER): Payer: BLUE CROSS/BLUE SHIELD | Admitting: Obstetrics and Gynecology

## 2018-03-10 ENCOUNTER — Other Ambulatory Visit: Payer: BLUE CROSS/BLUE SHIELD

## 2018-03-10 VITALS — BP 120/81 | HR 87 | Wt 253.6 lb

## 2018-03-10 DIAGNOSIS — O10912 Unspecified pre-existing hypertension complicating pregnancy, second trimester: Secondary | ICD-10-CM

## 2018-03-10 DIAGNOSIS — O99212 Obesity complicating pregnancy, second trimester: Secondary | ICD-10-CM

## 2018-03-10 DIAGNOSIS — O10919 Unspecified pre-existing hypertension complicating pregnancy, unspecified trimester: Secondary | ICD-10-CM

## 2018-03-10 DIAGNOSIS — O99210 Obesity complicating pregnancy, unspecified trimester: Secondary | ICD-10-CM

## 2018-03-10 DIAGNOSIS — Z3A26 26 weeks gestation of pregnancy: Secondary | ICD-10-CM

## 2018-03-10 DIAGNOSIS — Z23 Encounter for immunization: Secondary | ICD-10-CM

## 2018-03-10 DIAGNOSIS — O0992 Supervision of high risk pregnancy, unspecified, second trimester: Secondary | ICD-10-CM

## 2018-03-10 DIAGNOSIS — O0990 Supervision of high risk pregnancy, unspecified, unspecified trimester: Secondary | ICD-10-CM | POA: Diagnosis not present

## 2018-03-10 NOTE — Progress Notes (Signed)
Prenatal Visit Note Date: 03/10/2018 Clinic: [facility]  Subjective:  Tami Swanson is a 28 y.o. G1P0 at [redacted]w[redacted]d being seen today for ongoing prenatal care.  She is currently monitored for the following issues for this high-risk pregnancy and has Tobacco dependence syndrome; Condyloma acuminata of vulva in pregnancy, unspecified trimester; History of genital warts; Hirsutism; Supervision of high risk pregnancy, antepartum; Chronic hypertension complicating pregnancy, antepartum; Obesity (BMI 30-39.9); and Obesity in pregnancy on their problem list.  Patient reports no complaints.   Contractions: Not present. Vag. Bleeding: None.  Movement: Present. Denies leaking of fluid.   The following portions of the patient's history were reviewed and updated as appropriate: allergies, current medications, past family history, past medical history, past social history, past surgical history and problem list. Problem list updated.  Objective:   Vitals:   03/10/18 0823  BP: 120/81  Pulse: 87  Weight: 253 lb 9.6 oz (115 kg)    Fetal Status: Fetal Heart Rate (bpm): 159   Movement: Present     General:  Alert, oriented and cooperative. Patient is in no acute distress.  Skin: Skin is warm and dry. No rash noted.   Cardiovascular: Normal heart rate noted  Respiratory: Normal respiratory effort, no problems with respiration noted  Abdomen: Soft, gravid, appropriate for gestational age. Pain/Pressure: Absent     Pelvic:  Cervical exam deferred        Extremities: Normal range of motion.  Edema: Trace  Mental Status: Normal mood and affect. Normal behavior. Normal judgment and thought content.   Urinalysis:      Assessment and Plan:  Pregnancy: G1P0 at [redacted]w[redacted]d  1. Chronic hypertension complicating pregnancy, antepartum Doing well on just low dose asa. Surveillance growth tomorrow  2. Supervision of high risk pregnancy, antepartum Routine care. 28wk labs today  3. Obesity in  pregnancy  Preterm labor symptoms and general obstetric precautions including but not limited to vaginal bleeding, contractions, leaking of fluid and fetal movement were reviewed in detail with the patient. Please refer to After Visit Summary for other counseling recommendations.  Return in about 2 weeks (around 03/24/2018) for rob.   [place] Bing, MD

## 2018-03-11 ENCOUNTER — Ambulatory Visit (HOSPITAL_COMMUNITY)
Admission: RE | Admit: 2018-03-11 | Discharge: 2018-03-11 | Disposition: A | Discharge status: Home / Self Care | Payer: BLUE CROSS/BLUE SHIELD | Source: Ambulatory Visit | Attending: Obstetrics and Gynecology | Admitting: Obstetrics and Gynecology

## 2018-03-11 ENCOUNTER — Other Ambulatory Visit (HOSPITAL_COMMUNITY): Payer: Self-pay | Admitting: *Deleted

## 2018-03-11 ENCOUNTER — Encounter (HOSPITAL_COMMUNITY): Payer: Self-pay

## 2018-03-11 DIAGNOSIS — O10919 Unspecified pre-existing hypertension complicating pregnancy, unspecified trimester: Secondary | ICD-10-CM | POA: Diagnosis not present

## 2018-03-11 DIAGNOSIS — O288 Other abnormal findings on antenatal screening of mother: Secondary | ICD-10-CM | POA: Diagnosis not present

## 2018-03-11 DIAGNOSIS — O10012 Pre-existing essential hypertension complicating pregnancy, second trimester: Secondary | ICD-10-CM | POA: Diagnosis not present

## 2018-03-11 DIAGNOSIS — O99332 Smoking (tobacco) complicating pregnancy, second trimester: Secondary | ICD-10-CM | POA: Diagnosis not present

## 2018-03-11 DIAGNOSIS — O99212 Obesity complicating pregnancy, second trimester: Secondary | ICD-10-CM

## 2018-03-11 DIAGNOSIS — Z3A26 26 weeks gestation of pregnancy: Secondary | ICD-10-CM

## 2018-03-11 LAB — CBC
Hematocrit: 34.9 % (ref 34.0–46.6)
Hemoglobin: 12 g/dL (ref 11.1–15.9)
MCH: 29.9 pg (ref 26.6–33.0)
MCHC: 34.4 g/dL (ref 31.5–35.7)
MCV: 87 fL (ref 79–97)
Platelets: 309 10*3/uL (ref 150–450)
RBC: 4.01 x10E6/uL (ref 3.77–5.28)
RDW: 12.8 % (ref 11.7–15.4)
WBC: 11 10*3/uL — ABNORMAL HIGH (ref 3.4–10.8)

## 2018-03-11 LAB — GLUCOSE TOLERANCE, 2 HOURS W/ 1HR
GLUCOSE, FASTING: 74 mg/dL (ref 65–91)
Glucose, 1 hour: 127 mg/dL (ref 65–179)
Glucose, 2 hour: 94 mg/dL (ref 65–152)

## 2018-03-11 LAB — RPR: RPR Ser Ql: NONREACTIVE

## 2018-03-11 LAB — HIV ANTIBODY (ROUTINE TESTING W REFLEX): HIV SCREEN 4TH GENERATION: NONREACTIVE

## 2018-03-24 ENCOUNTER — Ambulatory Visit (INDEPENDENT_AMBULATORY_CARE_PROVIDER_SITE_OTHER): Payer: BLUE CROSS/BLUE SHIELD | Admitting: Obstetrics and Gynecology

## 2018-03-24 VITALS — BP 119/65 | HR 82 | Wt 254.8 lb

## 2018-03-24 DIAGNOSIS — E669 Obesity, unspecified: Secondary | ICD-10-CM

## 2018-03-24 DIAGNOSIS — O0992 Supervision of high risk pregnancy, unspecified, second trimester: Secondary | ICD-10-CM

## 2018-03-24 DIAGNOSIS — O99212 Obesity complicating pregnancy, second trimester: Secondary | ICD-10-CM

## 2018-03-24 DIAGNOSIS — O99210 Obesity complicating pregnancy, unspecified trimester: Secondary | ICD-10-CM

## 2018-03-24 DIAGNOSIS — O0990 Supervision of high risk pregnancy, unspecified, unspecified trimester: Secondary | ICD-10-CM

## 2018-03-24 DIAGNOSIS — O10919 Unspecified pre-existing hypertension complicating pregnancy, unspecified trimester: Secondary | ICD-10-CM

## 2018-03-24 DIAGNOSIS — O10912 Unspecified pre-existing hypertension complicating pregnancy, second trimester: Secondary | ICD-10-CM

## 2018-03-24 NOTE — Progress Notes (Signed)
Prenatal Visit Note Date: 03/24/2018 Clinic: [facility]  Subjective:  Tami Swanson is a 28 y.o. G1P0 at [redacted]w[redacted]d being seen today for ongoing prenatal care.  She is currently monitored for the following issues for this high-risk pregnancy and has Tobacco dependence syndrome; Condyloma acuminata of vulva in pregnancy, unspecified trimester; History of genital warts; Hirsutism; Supervision of high risk pregnancy, antepartum; Chronic hypertension complicating pregnancy, antepartum; Obesity (BMI 30-39.9); and Obesity in pregnancy on their problem list.  Patient reports no complaints.   Contractions: Not present.  .  Movement: Present. Denies leaking of fluid.   The following portions of the patient's history were reviewed and updated as appropriate: allergies, current medications, past family history, past medical history, past social history, past surgical history and problem list. Problem list updated.  Objective:   Vitals:   03/24/18 1421  BP: 119/65  Pulse: 82  Weight: 254 lb 12.8 oz (115.6 kg)    Fetal Status: Fetal Heart Rate (bpm): 148   Movement: Present     General:  Alert, oriented and cooperative. Patient is in no acute distress.  Skin: Skin is warm and dry. No rash noted.   Cardiovascular: Normal heart rate noted  Respiratory: Normal respiratory effort, no problems with respiration noted  Abdomen: Soft, gravid, appropriate for gestational age. Pain/Pressure: Absent     Pelvic:  Cervical exam deferred        Extremities: Normal range of motion.  Edema: Trace  Mental Status: Normal mood and affect. Normal behavior. Normal judgment and thought content.   Urinalysis:      Assessment and Plan:  Pregnancy: G1P0 at [redacted]w[redacted]d  1. Chronic hypertension complicating pregnancy, antepartum Doing well on just low dose asa. Normal growth two weeks ago. Continue with qmonth growth u/s and start qwk testing at 32wks  2. Supervision of high risk pregnancy,  antepartum Routine care. D/w pt re: BC nv  3. Obesity (BMI 30-39.9) Weight good  4. Obesity in pregnancy  Preterm labor symptoms and general obstetric precautions including but not limited to vaginal bleeding, contractions, leaking of fluid and fetal movement were reviewed in detail with the patient. Please refer to After Visit Summary for other counseling recommendations.  Return in about 2 weeks (around 04/07/2018).   [place] Bing, MD

## 2018-03-24 NOTE — Patient Instructions (Signed)
20 or 30 mmHg tightness for your TED hoses, compression stockings

## 2018-04-07 ENCOUNTER — Encounter: Payer: Self-pay | Admitting: Obstetrics and Gynecology

## 2018-04-07 ENCOUNTER — Ambulatory Visit (INDEPENDENT_AMBULATORY_CARE_PROVIDER_SITE_OTHER): Payer: BLUE CROSS/BLUE SHIELD | Admitting: Obstetrics and Gynecology

## 2018-04-07 VITALS — BP 121/81 | HR 72 | Wt 254.6 lb

## 2018-04-07 DIAGNOSIS — E669 Obesity, unspecified: Secondary | ICD-10-CM

## 2018-04-07 DIAGNOSIS — O0990 Supervision of high risk pregnancy, unspecified, unspecified trimester: Secondary | ICD-10-CM

## 2018-04-07 DIAGNOSIS — Z3A30 30 weeks gestation of pregnancy: Secondary | ICD-10-CM

## 2018-04-07 DIAGNOSIS — O10919 Unspecified pre-existing hypertension complicating pregnancy, unspecified trimester: Secondary | ICD-10-CM

## 2018-04-07 DIAGNOSIS — O0993 Supervision of high risk pregnancy, unspecified, third trimester: Secondary | ICD-10-CM

## 2018-04-07 MED ORDER — COMFORT FIT MATERNITY SUPP LG MISC: 0 refills | Status: DC

## 2018-04-07 NOTE — Progress Notes (Addendum)
   PRENATAL VISIT NOTE  Subjective:  Tami Swanson is a 28 y.o. G1P0 at [redacted]w[redacted]d being seen today for ongoing prenatal care.  She is currently monitored for the following issues for this high-risk pregnancy and has Tobacco dependence syndrome; Condyloma acuminata of vulva in pregnancy, unspecified trimester; History of genital warts; Hirsutism; Supervision of high risk pregnancy, antepartum; Chronic hypertension complicating pregnancy, antepartum; Obesity (BMI 30-39.9); and Obesity in pregnancy on their problem list.  Patient reports backache.  Contractions: Not present.  .  Movement: Present. Denies leaking of fluid.   The following portions of the patient's history were reviewed and updated as appropriate: allergies, current medications, past family history, past medical history, past social history, past surgical history and problem list. Problem list updated.  Objective:   Vitals:   04/07/18 1418  BP: 121/81  Pulse: 72  Weight: 254 lb 9.6 oz (115.5 kg)    Fetal Status: Fetal Heart Rate (bpm): 153 Fundal Height: 30 cm Movement: Present     General:  Alert, oriented and cooperative. Patient is in no acute distress.  Skin: Skin is warm and dry. No rash noted.   Cardiovascular: Normal heart rate noted  Respiratory: Normal respiratory effort, no problems with respiration noted  Abdomen: Soft, gravid, appropriate for gestational age.  Pain/Pressure: Absent     Pelvic: Cervical exam deferred        Extremities: Normal range of motion.  Edema: Trace  Mental Status: Normal mood and affect. Normal behavior. Normal judgment and thought content.   Assessment and Plan:  Pregnancy: G1P0 at [redacted]w[redacted]d  1. Supervision of high risk pregnancy, antepartum Patient is doing well without complaints Rx maternity support belt was provided  2. Chronic hypertension complicating pregnancy, antepartum Normotensive without medication Continue ASA Follow up growth ultrasound 2/12 Will start antenatal testing at  32 weeks  3. Obesity (BMI 30-39.9)   Preterm labor symptoms and general obstetric precautions including but not limited to vaginal bleeding, contractions, leaking of fluid and fetal movement were reviewed in detail with the patient. Please refer to After Visit Summary for other counseling recommendations.  Return in about 2 weeks (around 04/21/2018) for ROB, NST.  Future Appointments  Date Time Provider Department Center  04/08/2018  9:45 AM [facility] [place] 5 WH-[facility]-US  05/06/2018 10:00 AM [facility] [place] 3 WH-M[facility]-US    Catalina Antigua, MD

## 2018-04-07 NOTE — Addendum Note (Signed)
Addended by: Catalina Antigua on: 04/07/2018 02:48 PM   Modules accepted: Orders

## 2018-04-07 NOTE — Addendum Note (Signed)
Addended by: Scheryl MartenSOLIZ, Collyn Ribas on: 04/07/2018 02:44 PM   Modules accepted: Orders

## 2018-04-08 ENCOUNTER — Encounter (HOSPITAL_COMMUNITY): Payer: Self-pay

## 2018-04-08 ENCOUNTER — Ambulatory Visit (HOSPITAL_COMMUNITY)
Admission: RE | Admit: 2018-04-08 | Discharge: 2018-04-08 | Disposition: A | Discharge status: Home / Self Care | Payer: BLUE CROSS/BLUE SHIELD | Source: Ambulatory Visit | Attending: Obstetrics and Gynecology | Admitting: Obstetrics and Gynecology

## 2018-04-08 DIAGNOSIS — Z362 Encounter for other antenatal screening follow-up: Secondary | ICD-10-CM | POA: Diagnosis not present

## 2018-04-08 DIAGNOSIS — O99213 Obesity complicating pregnancy, third trimester: Secondary | ICD-10-CM | POA: Diagnosis not present

## 2018-04-08 DIAGNOSIS — O99333 Smoking (tobacco) complicating pregnancy, third trimester: Secondary | ICD-10-CM

## 2018-04-08 DIAGNOSIS — O10013 Pre-existing essential hypertension complicating pregnancy, third trimester: Secondary | ICD-10-CM | POA: Diagnosis not present

## 2018-04-08 DIAGNOSIS — O10919 Unspecified pre-existing hypertension complicating pregnancy, unspecified trimester: Secondary | ICD-10-CM | POA: Diagnosis not present

## 2018-04-08 DIAGNOSIS — Z3A30 30 weeks gestation of pregnancy: Secondary | ICD-10-CM

## 2018-04-08 DIAGNOSIS — O288 Other abnormal findings on antenatal screening of mother: Secondary | ICD-10-CM

## 2018-04-21 ENCOUNTER — Ambulatory Visit (INDEPENDENT_AMBULATORY_CARE_PROVIDER_SITE_OTHER): Payer: BLUE CROSS/BLUE SHIELD | Admitting: Obstetrics & Gynecology

## 2018-04-21 ENCOUNTER — Encounter: Payer: Self-pay | Admitting: Obstetrics & Gynecology

## 2018-04-21 VITALS — BP 139/94 | HR 102 | Wt 256.0 lb

## 2018-04-21 DIAGNOSIS — O10913 Unspecified pre-existing hypertension complicating pregnancy, third trimester: Secondary | ICD-10-CM | POA: Diagnosis not present

## 2018-04-21 DIAGNOSIS — O0993 Supervision of high risk pregnancy, unspecified, third trimester: Secondary | ICD-10-CM

## 2018-04-21 DIAGNOSIS — Z3A32 32 weeks gestation of pregnancy: Secondary | ICD-10-CM

## 2018-04-21 DIAGNOSIS — M549 Dorsalgia, unspecified: Secondary | ICD-10-CM

## 2018-04-21 DIAGNOSIS — O10919 Unspecified pre-existing hypertension complicating pregnancy, unspecified trimester: Secondary | ICD-10-CM | POA: Diagnosis not present

## 2018-04-21 DIAGNOSIS — O99891 Other specified diseases and conditions complicating pregnancy: Secondary | ICD-10-CM

## 2018-04-21 DIAGNOSIS — O9989 Other specified diseases and conditions complicating pregnancy, childbirth and the puerperium: Secondary | ICD-10-CM

## 2018-04-21 DIAGNOSIS — O0990 Supervision of high risk pregnancy, unspecified, unspecified trimester: Secondary | ICD-10-CM

## 2018-04-21 MED ORDER — CYCLOBENZAPRINE HCL 10 MG PO TABS: 10.0000 mg | ORAL_TABLET | Freq: Three times a day (TID) | ORAL | 1 refills | Status: DC | PRN

## 2018-04-21 NOTE — Patient Instructions (Signed)
Third Trimester of Pregnancy The third trimester is from week 28 through week 40 (months 7 through 9). The third trimester is a time when the unborn baby (fetus) is growing rapidly. At the end of the ninth month, the fetus is about 20 inches in length and weighs 6-10 pounds. Body changes during your third trimester Your body will continue to go through many changes during pregnancy. The changes vary from woman to woman. During the third trimester:  Your weight will continue to increase. You can expect to gain 25-35 pounds (11-16 kg) by the end of the pregnancy.  You may begin to get stretch marks on your hips, abdomen, and breasts.  You may urinate more often because the fetus is moving lower into your pelvis and pressing on your bladder.  You may develop or continue to have heartburn. This is caused by increased hormones that slow down muscles in the digestive tract.  You may develop or continue to have constipation because increased hormones slow digestion and cause the muscles that push waste through your intestines to relax.  You may develop hemorrhoids. These are swollen veins (varicose veins) in the rectum that can itch or be painful.  You may develop swollen, bulging veins (varicose veins) in your legs.  You may have increased body aches in the pelvis, back, or thighs. This is due to weight gain and increased hormones that are relaxing your joints.  You may have changes in your hair. These can include thickening of your hair, rapid growth, and changes in texture. Some women also have hair loss during or after pregnancy, or hair that feels dry or thin. Your hair will most likely return to normal after your baby is born.  Your breasts will continue to grow and they will continue to become tender. A yellow fluid (colostrum) may leak from your breasts. This is the first milk you are producing for your baby.  Your belly button may stick out.  You may notice more swelling in your hands,  face, or ankles.  You may have increased tingling or numbness in your hands, arms, and legs. The skin on your belly may also feel numb.  You may feel short of breath because of your expanding uterus.  You may have more problems sleeping. This can be caused by the size of your belly, increased need to urinate, and an increase in your body's metabolism.  You may notice the fetus "dropping," or moving lower in your abdomen (lightening).  You may have increased vaginal discharge.  You may notice your joints feel loose and you may have pain around your pelvic bone. What to expect at prenatal visits You will have prenatal exams every 2 weeks until week 36. Then you will have weekly prenatal exams. During a routine prenatal visit:  You will be weighed to make sure you and the baby are growing normally.  Your blood pressure will be taken.  Your abdomen will be measured to track your baby's growth.  The fetal heartbeat will be listened to.  Any test results from the previous visit will be discussed.  You may have a cervical check near your due date to see if your cervix has softened or thinned (effaced).  You will be tested for Group B streptococcus. This happens between 35 and 37 weeks. Your health care provider may ask you:  What your birth plan is.  How you are feeling.  If you are feeling the baby move.  If you have had any abnormal   symptoms, such as leaking fluid, bleeding, severe headaches, or abdominal cramping.  If you are using any tobacco products, including cigarettes, chewing tobacco, and electronic cigarettes.  If you have any questions. Other tests or screenings that may be performed during your third trimester include:  Blood tests that check for low iron levels (anemia).  Fetal testing to check the health, activity level, and growth of the fetus. Testing is done if you have certain medical conditions or if there are problems during the pregnancy.  Nonstress test  (NST). This test checks the health of your baby to make sure there are no signs of problems, such as the baby not getting enough oxygen. During this test, a belt is placed around your belly. The baby is made to move, and its heart rate is monitored during movement. What is false labor? False labor is a condition in which you feel small, irregular tightenings of the muscles in the womb (contractions) that usually go away with rest, changing position, or drinking water. These are called Braxton Hicks contractions. Contractions may last for hours, days, or even weeks before true labor sets in. If contractions come at regular intervals, become more frequent, increase in intensity, or become painful, you should see your health care provider. What are the signs of labor?  Abdominal cramps.  Regular contractions that start at 10 minutes apart and become stronger and more frequent with time.  Contractions that start on the top of the uterus and spread down to the lower abdomen and back.  Increased pelvic pressure and dull back pain.  A watery or bloody mucus discharge that comes from the vagina.  Leaking of amniotic fluid. This is also known as your "water breaking." It could be a slow trickle or a gush. Let your health care provider know if it has a color or strange odor. If you have any of these signs, call your health care provider right away, even if it is before your due date. Follow these instructions at home: Medicines  Follow your health care provider's instructions regarding medicine use. Specific medicines may be either safe or unsafe to take during pregnancy.  Take a prenatal vitamin that contains at least 600 micrograms (mcg) of folic acid.  If you develop constipation, try taking a stool softener if your health care provider approves. Eating and drinking   Eat a balanced diet that includes fresh fruits and vegetables, whole grains, good sources of protein such as meat, eggs, or tofu,  and low-fat dairy. Your health care provider will help you determine the amount of weight gain that is right for you.  Avoid raw meat and uncooked cheese. These carry germs that can cause birth defects in the baby.  If you have low calcium intake from food, talk to your health care provider about whether you should take a daily calcium supplement.  Eat four or five small meals rather than three large meals a day.  Limit foods that are high in fat and processed sugars, such as fried and sweet foods.  To prevent constipation: ? Drink enough fluid to keep your urine clear or pale yellow. ? Eat foods that are high in fiber, such as fresh fruits and vegetables, whole grains, and beans. Activity  Exercise only as directed by your health care provider. Most women can continue their usual exercise routine during pregnancy. Try to exercise for 30 minutes at least 5 days a week. Stop exercising if you experience uterine contractions.  Avoid heavy lifting.  Do   not exercise in extreme heat or humidity, or at high altitudes.  Wear low-heel, comfortable shoes.  Practice good posture.  You may continue to have sex unless your health care provider tells you otherwise. Relieving pain and discomfort  Take frequent breaks and rest with your legs elevated if you have leg cramps or low back pain.  Take warm sitz baths to soothe any pain or discomfort caused by hemorrhoids. Use hemorrhoid cream if your health care provider approves.  Wear a good support bra to prevent discomfort from breast tenderness.  If you develop varicose veins: ? Wear support pantyhose or compression stockings as told by your healthcare provider. ? Elevate your feet for 15 minutes, 3-4 times a day. Prenatal care  Write down your questions. Take them to your prenatal visits.  Keep all your prenatal visits as told by your health care provider. This is important. Safety  Wear your seat belt at all times when driving.  Make  a list of emergency phone numbers, including numbers for family, friends, the hospital, and police and fire departments. General instructions  Avoid cat litter boxes and soil used by cats. These carry germs that can cause birth defects in the baby. If you have a cat, ask someone to clean the litter box for you.  Do not travel far distances unless it is absolutely necessary and only with the approval of your health care provider.  Do not use hot tubs, steam rooms, or saunas.  Do not drink alcohol.  Do not use any products that contain nicotine or tobacco, such as cigarettes and e-cigarettes. If you need help quitting, ask your health care provider.  Do not use any medicinal herbs or unprescribed drugs. These chemicals affect the formation and growth of the baby.  Do not douche or use tampons or scented sanitary pads.  Do not cross your legs for long periods of time.  To prepare for the arrival of your baby: ? Take prenatal classes to understand, practice, and ask questions about labor and delivery. ? Make a trial run to the hospital. ? Visit the hospital and tour the maternity area. ? Arrange for maternity or paternity leave through employers. ? Arrange for family and friends to take care of pets while you are in the hospital. ? Purchase a rear-facing car seat and make sure you know how to install it in your car. ? Pack your hospital bag. ? Prepare the baby's nursery. Make sure to remove all pillows and stuffed animals from the baby's crib to prevent suffocation.  Visit your dentist if you have not gone during your pregnancy. Use a soft toothbrush to brush your teeth and be gentle when you floss. Contact a health care provider if:  You are unsure if you are in labor or if your water has broken.  You become dizzy.  You have mild pelvic cramps, pelvic pressure, or nagging pain in your abdominal area.  You have lower back pain.  You have persistent nausea, vomiting, or  diarrhea.  You have an unusual or bad smelling vaginal discharge.  You have pain when you urinate. Get help right away if:  Your water breaks before 37 weeks.  You have regular contractions less than 5 minutes apart before 37 weeks.  You have a fever.  You are leaking fluid from your vagina.  You have spotting or bleeding from your vagina.  You have severe abdominal pain or cramping.  You have rapid weight loss or weight gain.  You have   shortness of breath with chest pain.  You notice sudden or extreme swelling of your face, hands, ankles, feet, or legs.  Your baby makes fewer than 10 movements in 2 hours.  You have severe headaches that do not go away when you take medicine.  You have vision changes. Summary  The third trimester is from week 28 through week 40, months 7 through 9. The third trimester is a time when the unborn baby (fetus) is growing rapidly.  During the third trimester, your discomfort may increase as you and your baby continue to gain weight. You may have abdominal, leg, and back pain, sleeping problems, and an increased need to urinate.  During the third trimester your breasts will keep growing and they will continue to become tender. A yellow fluid (colostrum) may leak from your breasts. This is the first milk you are producing for your baby.  False labor is a condition in which you feel small, irregular tightenings of the muscles in the womb (contractions) that eventually go away. These are called Braxton Hicks contractions. Contractions may last for hours, days, or even weeks before true labor sets in.  Signs of labor can include: abdominal cramps; regular contractions that start at 10 minutes apart and become stronger and more frequent with time; watery or bloody mucus discharge that comes from the vagina; increased pelvic pressure and dull back pain; and leaking of amniotic fluid. This information is not intended to replace advice given to you by your  health care provider. Make sure you discuss any questions you have with your health care provider. Document Released: 02/05/2001 Document Revised: 03/19/2016 Document Reviewed: 03/19/2016 Else[employer] Interactive Patient Education  2019 [employer]   Hypertension During Pregnancy  Hypertension, commonly called high blood pressure, is when the force of blood pumping through your arteries is too strong. Arteries are blood vessels that carry blood from the heart throughout the body. Hypertension during pregnancy can cause problems for you and your baby. Your baby may be born early (prematurely) or may not weigh as much as he or she should at birth. Very bad cases of hypertension during pregnancy can be life-threatening. Different types of hypertension can occur during pregnancy. These include:  Chronic hypertension. This happens when: ? You have hypertension before pregnancy and it continues during pregnancy. ? You develop hypertension before you are [redacted] weeks pregnant, and it continues during pregnancy.  Gestational hypertension. This is hypertension that develops after the 20th week of pregnancy.  Preeclampsia, also called toxemia of pregnancy. This is a very serious type of hypertension that develops during pregnancy. It can be very dangerous for you and your baby. ? In rare cases, you may develop preeclampsia after giving birth (postpartum preeclampsia). This usually occurs within 48 hours after childbirth but may occur up to 6 weeks after giving birth. Gestational hypertension and preeclampsia usually go away within 6 weeks after your baby is born. Women who have hypertension during pregnancy have a greater chance of developing hypertension later in life or during future pregnancies. What are the causes? The exact cause of hypertension during pregnancy is not known. What increases the risk? There are certain factors that make it more likely for you to develop hypertension during pregnancy.  These include:  Having hypertension during a previous pregnancy or prior to pregnancy.  Being overweight.  Being age 105 or older.  Being pregnant for the first time.  Being pregnant with more than one baby.  Becoming pregnant using fertilization methods such as IVF (  in vitro fertilization).  Having diabetes, kidney problems, or systemic lupus erythematosus.  Having a family history of hypertension. What are the signs or symptoms? Chronic hypertension and gestational hypertension rarely cause symptoms. Preeclampsia causes symptoms, which may include:  Increased protein in your urine. Your health care provider will check for this at every visit before you give birth (prenatal visit).  Severe headaches.  Sudden weight gain.  Swelling of the hands, face, legs, and feet.  Nausea and vomiting.  Vision problems, such as blurred or double vision.  Numbness in the face, arms, legs, and feet.  Dizziness.  Slurred speech.  Sensitivity to bright lights.  Abdominal pain.  Convulsions or seizures. How is this diagnosed? You may be diagnosed with hypertension during a routine prenatal exam. At each prenatal visit, you may:  Have a urine test to check for high amounts of protein in your urine.  Have your blood pressure checked. A blood pressure reading is given as two numbers, such as "120 over 80" (or 120/80). The first ("top") number is a measure of the pressure in your arteries when your heart beats (systolic pressure). The second ("bottom") number is a measure of the pressure in your arteries as your heart relaxes between beats (diastolic pressure). Blood pressure is measured in a unit called mm Hg. For most women, a normal blood pressure reading is: ? Systolic: below 120. ? Diastolic: below 80. The type of hypertension that you are diagnosed with depends on your test results and when your symptoms developed.  Chronic hypertension is usually diagnosed before 20 weeks of  pregnancy.  Gestational hypertension is usually diagnosed after 20 weeks of pregnancy.  Hypertension with high amounts of protein in the urine is diagnosed as preeclampsia.  Blood pressure measurements that stay above 160 systolic, or above 110 diastolic, are signs of severe preeclampsia. How is this treated? Treatment for hypertension during pregnancy varies depending on the type of hypertension you have and how serious it is.  If you take medicines called ACE inhibitors to treat chronic hypertension, you may need to switch medicines. ACE inhibitors should not be taken during pregnancy.  If you have gestational hypertension, you may need to take blood pressure medicine.  If you are at risk for preeclampsia, your health care provider may recommend that you take a low-dose aspirin during your pregnancy.  If you have severe preeclampsia, you may need to be hospitalized so you and your baby can be monitored closely. You may also need to take medicine (magnesium sulfate) to prevent seizures and to lower blood pressure. This medicine may be given as an injection or through an IV.  In some cases, if your condition gets worse, you may need to deliver your baby early. Follow these instructions at home: Eating and drinking   Drink enough fluid to keep your urine pale yellow.  Avoid caffeine. Lifestyle  Do not use any products that contain nicotine or tobacco, such as cigarettes and e-cigarettes. If you need help quitting, ask your health care provider.  Do not use alcohol or drugs.  Avoid stress as much as possible. Rest and get plenty of sleep. General instructions  Take over-the-counter and prescription medicines only as told by your health care provider.  While lying down, lie on your left side. This keeps pressure off your major blood vessels.  While sitting or lying down, raise (elevate) your feet. Try putting some pillows under your lower legs.  Exercise regularly. Ask your  health care provider what kinds  of exercise are best for you.  Keep all prenatal and follow-up visits as told by your health care provider. This is important. Contact a health care provider if:  You have symptoms that your health care provider told you may require more treatment or monitoring, such as: ? Nausea or vomiting. ? Headache. Get help right away if you have:  Severe abdominal pain that does not get better with treatment.  A severe headache that does not get better.  Vomiting that does not get better.  Sudden, rapid weight gain.  Sudden swelling in your hands, ankles, or face.  Vaginal bleeding.  Blood in your urine.  Fewer movements from your baby than usual.  Blurred or double vision.  Muscle twitching or sudden muscle tightening (spasms).  Shortness of breath.  Blue fingernails or lips. Summary  Hypertension, commonly called high blood pressure, is when the force of blood pumping through your arteries is too strong.  Hypertension during pregnancy can cause problems for you and your baby.  Treatment for hypertension during pregnancy varies depending on the type of hypertension you have and how serious it is.  Get help right away if you have symptoms that your health care provider told you to watch for. This information is not intended to replace advice given to you by your health care provider. Make sure you discuss any questions you have with your health care provider. Document Released: 10/30/2010 Document Revised: 01/28/2017 Document Reviewed: 07/28/2015 [employer]vier Interactive Patient Education  2019 [employer].

## 2018-04-21 NOTE — Progress Notes (Signed)
PRENATAL VISIT NOTE  Subjective:  Tami Swanson is a 28 y.o. G1P0 at 7123w2d being seen today for ongoing prenatal care.  She is currently monitored for the following issues for this high-risk pregnancy and has Tobacco dependence syndrome; Condyloma acuminata of vulva in pregnancy, unspecified trimester; History of genital warts; Hirsutism; Supervision of high risk pregnancy, antepartum; Chronic hypertension complicating pregnancy, antepartum; Obesity (BMI 30-39.9); and Obesity in pregnancy on their problem list.  Patient reports back pain especially after prolonged work hours. Patient denies any headaches, visual symptoms, RUQ/epigastric pain or other concerning symptoms.  Contractions: Irritability. Vag. Bleeding: None.  Movement: Present. Denies leaking of fluid.   The following portions of the patient's history were reviewed and updated as appropriate: allergies, current medications, past family history, past medical history, past social history, past surgical history and problem list. Problem list updated.  Objective:   Vitals:   04/21/18 1511  BP: (!) 139/94  Pulse: (!) 102  Weight: 256 lb (116.1 kg)    Fetal Status: Fetal Heart Rate (bpm): NST   Movement: Present     General:  Alert, oriented and cooperative. Patient is in no acute distress.  Skin: Skin is warm and dry. No rash noted.   Cardiovascular: Normal heart rate noted  Respiratory: Normal respiratory effort, no problems with respiration noted  Abdomen: Soft, gravid, appropriate for gestational age.  Pain/Pressure: Absent     Pelvic: Cervical exam deferred        Extremities: Normal range of motion.  Edema: Trace  Mental Status: Normal mood and affect. Normal behavior. Normal judgment and thought content.   Imaging [place] [facility] Ob Follow Up  Result Date: 04/08/2018 ----------------------------------------------------------------------  OBSTETRICS REPORT                         (Signed Final 04/08/2018 10:36 am)  ---------------------------------------------------------------------- Patient Info  ID #:        [id number]                          D.O.B.:  [birth year] (27 yrs)  Name:        Tami Swanson                     Visit Date: 04/08/2018 10:05 am ---------------------------------------------------------------------- Performed By  Performed By:      Percell BostonHeather Waken          Ref. Address:      [id number] [place]                     [address]                                      [address]  Attending:         Lin Landsmanorenthian Booker      [facility]          [facility]                     MD  [facility]       [facility] ---------------------------------------------------------------------- Orders   #  Description                           Code         Ordered By   1  [place] [facility] OB FOLLOW UP                   45409.81     Lin Landsman  ----------------------------------------------------------------------   #  Order #                     Accession #                 Episode #   1  [id number]                   [id number]                  [id number]  ---------------------------------------------------------------------- Indications   Hypertension - Chronic/Pre-existing             O10.019   Smoking complicating pregnancy, second          U13.244   trimester   Obesity complicating pregnancy, second          O99.212   trimester   Other abnormal finding on antenatal screening   O28.8   of mother (Insufficient fetal DNA, FF 1.1%)   Declined GC   Encounter for other antenatal screening         Z36.2   follow-up   [redacted] weeks gestation of pregnancy                 Z3A.30  ---------------------------------------------------------------------- Fetal Evaluation  Num Of Fetuses:          1  Fetal Heart Rate(bpm):   159  Cardiac Activity:        Observed  Presentation:            Cephalic  Placenta:                Posterior  P. Cord Insertion:       Previously seen as normal  Amniotic Fluid   AFI FV:      Within normal limits  AFI Sum(cm)     %Tile       Largest Pocket(cm)  18.96           72          6.16  RUQ(cm)       RLQ(cm)        LUQ(cm)        LLQ(cm)  4.96          3.23           6.16           4.61 ---------------------------------------------------------------------- Biometry  BPD:      82.3   mm     G. Age:  33w 1d         97  %    CI:         75.04  %    70 - 86                                                           FL/HC:  19.7  %    19.2 - 21.4  HC:      301.4   mm     G. Age:  33w 3d         91  %    HC/AC:       1.06       0.99 - 1.21  AC:      284.6   mm     G. Age:  32w 3d         93  %    FL/BPD:      72.1  %    71 - 87  FL:       59.3   mm     G. Age:  30w 6d         49  %    FL/AC:       20.8  %    20 - 24  Est. FW:    1916   gm      4 lb 4 oz     80  % ---------------------------------------------------------------------- OB History  Gravidity:     1 ---------------------------------------------------------------------- Gestational Age  LMP:            30w 3d       Date:  09/07/17                   EDD:  06/14/18  U/S Today:      32w 3d                                         EDD:  05/31/18  Best:           30w 3d    Det. By:  LMP  (09/07/17)            EDD:  06/14/18 ---------------------------------------------------------------------- Anatomy  Cranium:                Appears normal         LVOT:                   Previously seen  Cavum:                  Previously seen        Aortic Arch:            Previously seen  Ventricles:             Appears normal         Ductal Arch:            Previously seen  Choroid Plexus:         Previously seen        Diaphragm:              Previously seen  Cerebellum:             Previously seen        Stomach:                Appears normal, left  sided  Posterior Fossa:        Previously seen        Abdomen:                Previously seen  Nuchal Fold:             Previously seen        Abdominal Wall:         Previously seen  Face:                   Orbits and profile     Cord Vessels:           Previously seen                          previously seen  Lips:                   Previously seen        Kidneys:                Appear normal  Palate:                 Previously seen        Bladder:                Appears normal  Thoracic:               Appears normal         Spine:                  Previously seen  Heart:                  Previously seen        Upper Extremities:      Previously seen  RVOT:                   Previously seen        Lower Extremities:      Previously seen  Other:   Female gender previously seen. Right heel previously visualized. Lenses           previously visualized. ---------------------------------------------------------------------- Cervix Uterus Adnexa  Cervix  Not visualized (advanced GA >24wks)  Uterus  No abnormality visualized.  Left Ovary  No adnexal mass visualized.  Right Ovary  No adnexal mass visualized.  Cul De Sac  No free fluid seen.  Adnexa  No abnormality visualized. ---------------------------------------------------------------------- Impression  Normal interval growth. ---------------------------------------------------------------------- Recommendations  Repeat growth in 4 weeks. ----------------------------------------------------------------------               Lin Landsman, MD Electronically Signed Final Report   04/08/2018 10:36 am ----------------------------------------------------------------------   Assessment and Plan:  Pregnancy: G1P0 at [redacted]w[redacted]d  1. Chronic hypertension complicating pregnancy, antepartum BP more elevated today, will check labs. No symptoms. - Comprehensive metabolic panel - CBC - Protein / creatinine ratio, urine NST performed today was reviewed and was found to be reactive.  Continue recommended antenatal testing and prenatal care. BPP and growth scans as per MFM.  2. Back pain  affecting pregnancy in third trimester Continue abdominal binder at work, Tylenol then Flexeril as needed. - cyclobenzaprine (FLEXERIL) 10 MG tablet; Take 1 tablet (10 mg total) by mouth every 8 (eight) hours as needed for muscle spasms.  Dispense: 30 tablet; Refill: 1  3. Supervision of high risk pregnancy, antepartum Preterm labor symptoms and general obstetric precautions including  but not limited to vaginal bleeding, contractions, leaking of fluid and fetal movement were reviewed in detail with the patient. Please refer to After Visit Summary for other counseling recommendations.  Return in about 1 week (around 04/28/2018) for OB Visit, NST.  Future Appointments  Date Time Provider Department Center  04/22/2018  3:35 PM [facility] NURSE [facility]  04/22/2018  3:45 PM [facility] [place] 2 [facility]  04/29/2018  2:15 PM Yarissa Reining, Jethro Bastos, MD [facility]  05/06/2018  9:50 AM [facility] NURSE [facility]  05/06/2018 10:00 AM [facility] [place] 3 [facility]  05/06/2018  2:45 PM Calvert Cantor, CNM [facility]  05/12/2018  3:45 PM Reva Bores, MD [facility]    Jaynie Collins, MD

## 2018-04-22 ENCOUNTER — Encounter (HOSPITAL_COMMUNITY): Payer: Self-pay

## 2018-04-22 ENCOUNTER — Ambulatory Visit (HOSPITAL_COMMUNITY): Payer: BLUE CROSS/BLUE SHIELD | Admitting: *Deleted

## 2018-04-22 ENCOUNTER — Ambulatory Visit (HOSPITAL_COMMUNITY)
Admission: RE | Admit: 2018-04-22 | Discharge: 2018-04-22 | Disposition: A | Discharge status: Home / Self Care | Payer: BLUE CROSS/BLUE SHIELD | Source: Ambulatory Visit | Attending: Obstetrics and Gynecology | Admitting: Obstetrics and Gynecology

## 2018-04-22 VITALS — BP 132/67 | HR 95 | Wt 255.0 lb

## 2018-04-22 DIAGNOSIS — O10019 Pre-existing essential hypertension complicating pregnancy, unspecified trimester: Secondary | ICD-10-CM

## 2018-04-22 DIAGNOSIS — E669 Obesity, unspecified: Secondary | ICD-10-CM | POA: Insufficient documentation

## 2018-04-22 DIAGNOSIS — O99212 Obesity complicating pregnancy, second trimester: Secondary | ICD-10-CM | POA: Diagnosis not present

## 2018-04-22 DIAGNOSIS — O10919 Unspecified pre-existing hypertension complicating pregnancy, unspecified trimester: Secondary | ICD-10-CM

## 2018-04-22 DIAGNOSIS — O0990 Supervision of high risk pregnancy, unspecified, unspecified trimester: Secondary | ICD-10-CM

## 2018-04-22 DIAGNOSIS — O288 Other abnormal findings on antenatal screening of mother: Secondary | ICD-10-CM | POA: Diagnosis not present

## 2018-04-22 DIAGNOSIS — O99332 Smoking (tobacco) complicating pregnancy, second trimester: Secondary | ICD-10-CM | POA: Diagnosis not present

## 2018-04-22 DIAGNOSIS — Z3A32 32 weeks gestation of pregnancy: Secondary | ICD-10-CM

## 2018-04-22 LAB — COMPREHENSIVE METABOLIC PANEL
ALT: 14 IU/L (ref 0–32)
AST: 11 IU/L (ref 0–40)
Albumin/Globulin Ratio: 1.3 (ref 1.2–2.2)
Albumin: 3.5 g/dL — ABNORMAL LOW (ref 3.9–5.0)
Alkaline Phosphatase: 155 IU/L — ABNORMAL HIGH (ref 39–117)
BUN/Creatinine Ratio: 9 (ref 9–23)
BUN: 5 mg/dL — ABNORMAL LOW (ref 6–20)
Bilirubin Total: <0.2 mg/dL (ref 0.0–1.2)
CO2: 18 mmol/L — AB (ref 20–29)
Calcium: 9.4 mg/dL (ref 8.7–10.2)
Chloride: 105 mmol/L (ref 96–106)
Creatinine, Ser: 0.54 mg/dL — ABNORMAL LOW (ref 0.57–1.00)
GFR calc Af Amer: 147 mL/min/{1.73_m2} (ref >59)
GFR calc non Af Amer: 128 mL/min/{1.73_m2} (ref >59)
Globulin, Total: 2.6 g/dL (ref 1.5–4.5)
Glucose: 76 mg/dL (ref 65–99)
Potassium: 4.2 mmol/L (ref 3.5–5.2)
Sodium: 137 mmol/L (ref 134–144)
Total Protein: 6.1 g/dL (ref 6.0–8.5)

## 2018-04-22 LAB — CBC
Hematocrit: 34.9 % (ref 34.0–46.6)
Hemoglobin: 12.3 g/dL (ref 11.1–15.9)
MCH: 29.9 pg (ref 26.6–33.0)
MCHC: 35.2 g/dL (ref 31.5–35.7)
MCV: 85 fL (ref 79–97)
Platelets: 301 10*3/uL (ref 150–450)
RBC: 4.12 x10E6/uL (ref 3.77–5.28)
RDW: 12.6 % (ref 11.7–15.4)
WBC: 11 10*3/uL — AB (ref 3.4–10.8)

## 2018-04-22 LAB — PROTEIN / CREATININE RATIO, URINE
Creatinine, Urine: 37.7 mg/dL
PROTEIN/CREAT RATIO: 223 mg/g{creat} — AB (ref 0–200)
Protein, Ur: 8.4 mg/dL

## 2018-04-23 ENCOUNTER — Other Ambulatory Visit (HOSPITAL_COMMUNITY): Payer: Self-pay | Admitting: *Deleted

## 2018-04-23 DIAGNOSIS — O10919 Unspecified pre-existing hypertension complicating pregnancy, unspecified trimester: Secondary | ICD-10-CM

## 2018-04-29 ENCOUNTER — Encounter: Payer: Self-pay | Admitting: Obstetrics & Gynecology

## 2018-04-29 ENCOUNTER — Ambulatory Visit (INDEPENDENT_AMBULATORY_CARE_PROVIDER_SITE_OTHER): Payer: BLUE CROSS/BLUE SHIELD | Admitting: Obstetrics & Gynecology

## 2018-04-29 VITALS — BP 137/84 | HR 108 | Wt 256.0 lb

## 2018-04-29 DIAGNOSIS — O10913 Unspecified pre-existing hypertension complicating pregnancy, third trimester: Secondary | ICD-10-CM | POA: Diagnosis not present

## 2018-04-29 DIAGNOSIS — Z3A33 33 weeks gestation of pregnancy: Secondary | ICD-10-CM | POA: Diagnosis not present

## 2018-04-29 DIAGNOSIS — O10919 Unspecified pre-existing hypertension complicating pregnancy, unspecified trimester: Secondary | ICD-10-CM

## 2018-04-29 DIAGNOSIS — O0993 Supervision of high risk pregnancy, unspecified, third trimester: Secondary | ICD-10-CM | POA: Diagnosis not present

## 2018-04-29 DIAGNOSIS — O0990 Supervision of high risk pregnancy, unspecified, unspecified trimester: Secondary | ICD-10-CM

## 2018-04-29 NOTE — Progress Notes (Signed)
   PRENATAL VISIT NOTE  Subjective:  Tami Swanson is a 28 y.o. G1P0 at [redacted]w[redacted]d being seen today for ongoing prenatal care.  She is currently monitored for the following issues for this high-risk pregnancy and has Tobacco dependence syndrome; Condyloma acuminata of vulva in pregnancy, unspecified trimester; History of genital warts; Hirsutism; Supervision of high risk pregnancy, antepartum; Chronic hypertension complicating pregnancy, antepartum; Obesity (BMI 30-39.9); and Obesity in pregnancy on their problem list.  Patient reports no complaints. Patient denies any headaches, visual symptoms, RUQ/epigastric pain or other concerning symptoms.  Contractions: Not present. Vag. Bleeding: None.  Movement: Present. Denies leaking of fluid.   The following portions of the patient's history were reviewed and updated as appropriate: allergies, current medications, past family history, past medical history, past social history, past surgical history and problem list. Problem list updated.  Objective:   Vitals:   04/29/18 1421  BP: 137/84  Pulse: (!) 108  Weight: 256 lb (116.1 kg)    Fetal Status: Fetal Heart Rate (bpm): NST   Movement: Present     General:  Alert, oriented and cooperative. Patient is in no acute distress.  Skin: Skin is warm and dry. No rash noted.   Cardiovascular: Normal heart rate noted  Respiratory: Normal respiratory effort, no problems with respiration noted  Abdomen: Soft, gravid, appropriate for gestational age.  Pain/Pressure: Absent     Pelvic: Cervical exam deferred        Extremities: Normal range of motion.  Edema: Trace  Mental Status: Normal mood and affect. Normal behavior. Normal judgment and thought content.   Assessment and Plan:  Pregnancy: G1P0 at [redacted]w[redacted]d  1. Chronic hypertension complicating pregnancy, antepartum BP stable. NST performed today was reviewed and was found to be reactive.  Continue recommended antenatal testing and prenatal care. Next anatomy  scan is on 05/06/18. Next few weekly BPP ordered, to be done at [facility]. PEC precautions reviewed. - [place] [facility] FETAL BPP WO NON STRESS; Future - [place] [facility] FETAL BPP WO NON STRESS; Future - [place] [facility] FETAL BPP WO NON STRESS; Future - [place] [facility] FETAL BPP WO NON STRESS; Future  2. Supervision of high risk pregnancy, antepartum Preterm labor symptoms and general obstetric precautions including but not limited to vaginal bleeding, contractions, leaking of fluid and fetal movement were reviewed in detail with the patient. Please refer to After Visit Summary for other counseling recommendations.  Return in about 1 week (around 05/06/2018) for OB Visit, NST.  Future Appointments  Date Time Provider Department Center  05/06/2018  9:50 AM [facility] NURSE [facility]  05/06/2018 10:00 AM [facility] [place] 3 [facility]  05/06/2018  2:45 PM Calvert Cantor, CNM [facility]  05/12/2018  3:45 PM Reva Bores, MD [facility]  05/20/2018  2:50 PM [facility] NURSE [facility]  05/20/2018  3:00 PM [facility] [place] 3 [facility]    Jaynie Collins, MD

## 2018-04-29 NOTE — Patient Instructions (Signed)
Return to office for any scheduled appointments. Call the office or go to the MAU at [facility] if:  You begin to have strong, frequent contractions  Your water breaks.  Sometimes it is a big gush of fluid, sometimes it is just a trickle that keeps getting your panties wet or running down your legs  You have vaginal bleeding.  It is normal to have a small amount of spotting if your cervix was checked.   You do not feel your baby moving like normal.  If you do not, get something to eat and drink and lay down and focus on feeling your baby move.   If your baby is still not moving like normal, you should call the office or go to MAU.  Any other obstetric concerns.   

## 2018-05-06 ENCOUNTER — Encounter (HOSPITAL_COMMUNITY): Payer: Self-pay

## 2018-05-06 ENCOUNTER — Other Ambulatory Visit (HOSPITAL_COMMUNITY): Payer: Self-pay | Admitting: *Deleted

## 2018-05-06 ENCOUNTER — Ambulatory Visit (INDEPENDENT_AMBULATORY_CARE_PROVIDER_SITE_OTHER): Payer: BLUE CROSS/BLUE SHIELD | Admitting: Advanced Practice Midwife

## 2018-05-06 ENCOUNTER — Other Ambulatory Visit: Payer: Self-pay

## 2018-05-06 ENCOUNTER — Ambulatory Visit (HOSPITAL_COMMUNITY): Payer: BLUE CROSS/BLUE SHIELD | Admitting: *Deleted

## 2018-05-06 ENCOUNTER — Ambulatory Visit (HOSPITAL_COMMUNITY)
Admission: RE | Admit: 2018-05-06 | Discharge: 2018-05-06 | Disposition: A | Discharge status: Home / Self Care | Payer: BLUE CROSS/BLUE SHIELD | Source: Ambulatory Visit | Attending: Obstetrics and Gynecology | Admitting: Obstetrics and Gynecology

## 2018-05-06 VITALS — BP 130/81 | HR 107 | Wt 258.2 lb

## 2018-05-06 VITALS — BP 124/75 | HR 105 | Wt 259.0 lb

## 2018-05-06 DIAGNOSIS — O10013 Pre-existing essential hypertension complicating pregnancy, third trimester: Secondary | ICD-10-CM

## 2018-05-06 DIAGNOSIS — O288 Other abnormal findings on antenatal screening of mother: Secondary | ICD-10-CM | POA: Diagnosis not present

## 2018-05-06 DIAGNOSIS — O0993 Supervision of high risk pregnancy, unspecified, third trimester: Secondary | ICD-10-CM

## 2018-05-06 DIAGNOSIS — Z3A34 34 weeks gestation of pregnancy: Secondary | ICD-10-CM

## 2018-05-06 DIAGNOSIS — O99213 Obesity complicating pregnancy, third trimester: Secondary | ICD-10-CM

## 2018-05-06 DIAGNOSIS — Z362 Encounter for other antenatal screening follow-up: Secondary | ICD-10-CM | POA: Diagnosis not present

## 2018-05-06 DIAGNOSIS — O10919 Unspecified pre-existing hypertension complicating pregnancy, unspecified trimester: Secondary | ICD-10-CM

## 2018-05-06 DIAGNOSIS — O99333 Smoking (tobacco) complicating pregnancy, third trimester: Secondary | ICD-10-CM | POA: Diagnosis not present

## 2018-05-06 DIAGNOSIS — O10913 Unspecified pre-existing hypertension complicating pregnancy, third trimester: Secondary | ICD-10-CM

## 2018-05-06 DIAGNOSIS — O0990 Supervision of high risk pregnancy, unspecified, unspecified trimester: Secondary | ICD-10-CM

## 2018-05-06 DIAGNOSIS — E669 Obesity, unspecified: Secondary | ICD-10-CM

## 2018-05-06 NOTE — Progress Notes (Signed)
   PRENATAL VISIT NOTE  Subjective:  Tami Swanson is a 28 y.o. G1P0 at [redacted]w[redacted]d being seen today for ongoing prenatal care.  She is currently monitored for the following issues for this high-risk pregnancy and has Tobacco dependence syndrome; Condyloma acuminata of vulva in pregnancy, unspecified trimester; History of genital warts; Hirsutism; Supervision of high risk pregnancy, antepartum; Chronic hypertension complicating pregnancy, antepartum; Obesity (BMI 30-39.9); and Obesity in pregnancy on their problem list.  Patient reports no complaints. She denies headache, visual disturances, RUQ/abdominal pain, new onset swelling or weight gain  Contractions: Not present. Vag. Bleeding: None.  Movement: Present. Denies leaking of fluid.   The following portions of the patient's history were reviewed and updated as appropriate: allergies, current medications, past family history, past medical history, past social history, past surgical history and problem list. Problem list updated.  Objective:   Vitals:   05/06/18 1446  BP: 124/75  Pulse: (!) 105  Weight: 259 lb (117.5 kg)    Fetal Status: Fetal Heart Rate (bpm): NST   Movement: Present     General:  Alert, oriented and cooperative. Patient is in no acute distress.  Skin: Skin is warm and dry. No rash noted.   Cardiovascular: Normal heart rate noted  Respiratory: Normal respiratory effort, no problems with respiration noted  Abdomen: Soft, gravid, appropriate for gestational age.  Pain/Pressure: Absent     Pelvic: Cervical exam deferred        Extremities: Normal range of motion.  Edema: Trace  Mental Status: Normal mood and affect. Normal behavior. Normal judgment and thought content.   Assessment and Plan:  Pregnancy: G1P0 at [redacted]w[redacted]d  1. Supervision of high risk pregnancy, antepartum --Reactive NST today. Antenatal testing scheduled through 06/09/2018 --Confirmed patient knows location of MAU in [facility]  2. Chronic hypertension  complicating pregnancy, antepartum --Normotensive, asymptomatic --Reviewed signs/symptoms associated with Preeclampsia   3. Obesity (BMI 30-39.9) --1lb gain from previous visit  Preterm labor symptoms and general obstetric precautions including but not limited to vaginal bleeding, contractions, leaking of fluid and fetal movement were reviewed in detail with the patient. Please refer to After Visit Summary for other counseling recommendations.   No follow-ups on file.  Future Appointments  Date Time Provider Department Center  05/12/2018  3:45 PM Reva Bores, MD [facility]  05/13/2018  1:50 PM [facility] NURSE [facility]  05/13/2018  2:00 PM [facility] [place] 3 [facility]  05/19/2018  3:00 PM Anyanwu, Jethro Bastos, MD [facility]  05/20/2018  2:50 PM [facility] NURSE [facility]  05/20/2018  3:00 PM [facility] [place] 3 [facility]  05/26/2018  8:45 AM Allie Bossier, MD [facility]  05/27/2018  2:30 PM [facility] NURSE [facility]  05/27/2018  2:45 PM [facility] [place] 5 [facility] M[facility]-US  06/02/2018  3:00 PM Allie Bossier, MD [facility]  06/03/2018  2:50 PM [facility] NURSE [facility]FC-US  06/03/2018  3:00 PM [facility] [place] 3 [facility]-US  06/09/2018  3:00 PM Anyanwu, Jethro Bastos, MD [facility]    Calvert Cantor, CNM

## 2018-05-06 NOTE — Patient Instructions (Signed)
.  Preeclampsia and Eclampsia    Preeclampsia is a serious condition that may develop during pregnancy. It is also called toxemia of pregnancy. This condition causes high blood pressure along with other symptoms, such as swelling and headaches. These symptoms may develop as the condition gets worse. Preeclampsia may occur at 20 weeks of pregnancy or later.  Diagnosing and treating preeclampsia early is very important. If not treated early, it can cause serious problems for you and your baby. One problem it can lead to is eclampsia. Eclampsia is a condition that causes muscle jerking or shaking (convulsions or seizures) and other serious problems for the mother. During pregnancy, delivering your baby may be the best treatment for preeclampsia or eclampsia. For most women, preeclampsia and eclampsia symptoms go away after giving birth.  In rare cases, a woman may develop preeclampsia after giving birth (postpartum preeclampsia). This usually occurs within 48 hours after childbirth but may occur up to 6 weeks after giving birth.  What are the causes?  The cause of preeclampsia is not known.  What increases the risk?  The following risk factors make you more likely to develop preeclampsia:   Being pregnant for the first time.   Having had preeclampsia during a past pregnancy.   Having a family history of preeclampsia.   Having high blood pressure.   Being pregnant with more than one baby.   Being 35 or older.   Being African-American.   Having kidney disease or diabetes.   Having medical conditions such as lupus or blood diseases.   Being very overweight (obese).  What are the signs or symptoms?  The earliest signs of preeclampsia are:   High blood pressure.   Increased protein in your urine. Your health care provider will check for this at every visit before you give birth (prenatal visit).  Other symptoms that may develop as the condition gets worse include:   Severe headaches.   Sudden weight  gain.   Swelling of the hands, face, legs, and feet.   Nausea and vomiting.   Vision problems, such as blurred or double vision.   Numbness in the face, arms, legs, and feet.   Urinating less than usual.   Dizziness.   Slurred speech.   Abdominal pain, especially upper abdominal pain.   Convulsions or seizures.  How is this diagnosed?  There are no screening tests for preeclampsia. Your health care provider will ask you about symptoms and check for signs of preeclampsia during your prenatal visits. You may also have tests that include:   Urine tests.   Blood tests.   Checking your blood pressure.   Monitoring your baby's heart rate.   Ultrasound.  How is this treated?  You and your health care provider will determine the treatment approach that is best for you. Treatment may include:   Having more frequent prenatal exams to check for signs of preeclampsia, if you have an increased risk for preeclampsia.   Medicine to lower your blood pressure.   Staying in the hospital, if your condition is severe. There, treatment will focus on controlling your blood pressure and the amount of fluids in your body (fluid retention).   Taking medicine (magnesium sulfate) to prevent seizures. This may be given as an injection or through an IV.   Taking a low-dose aspirin during your pregnancy.   Delivering your baby early, if your condition gets worse. You may have your labor started with medicine (induced), or you may have a cesarean   delivery.  Follow these instructions at home:  Eating and drinking     Drink enough fluid to keep your urine pale yellow.   Avoid caffeine.  Lifestyle   Do not use any products that contain nicotine or tobacco, such as cigarettes and e-cigarettes. If you need help quitting, ask your health care provider.   Do not use alcohol or drugs.   Avoid stress as much as possible. Rest and get plenty of sleep.  General instructions   Take over-the-counter and prescription medicines only as  told by your health care provider.   When lying down, lie on your left side. This keeps pressure off your major blood vessels.   When sitting or lying down, raise (elevate) your feet. Try putting some pillows underneath your lower legs.   Exercise regularly. Ask your health care provider what kinds of exercise are best for you.   Keep all follow-up and prenatal visits as told by your health care provider. This is important.  How is this prevented?  There is no known way of preventing preeclampsia or eclampsia from developing. However, to lower your risk of complications and detect problems early:   Get regular prenatal care. Your health care provider may be able to diagnose and treat the condition early.   Maintain a healthy weight. Ask your health care provider for help managing weight gain during pregnancy.   Work with your health care provider to manage any long-term (chronic) health conditions you have, such as diabetes or kidney problems.   You may have tests of your blood pressure and kidney function after giving birth.   Your health care provider may have you take low-dose aspirin during your next pregnancy.  Contact a health care provider if:   You have symptoms that your health care provider told you may require more treatment or monitoring, such as:  ? Headaches.  ? Nausea or vomiting.  ? Abdominal pain.  ? Dizziness.  ? Light-headedness.  Get help right away if:   You have severe:  ? Abdominal pain.  ? Headaches that do not get better.  ? Dizziness.  ? Vision problems.  ? Confusion.  ? Nausea or vomiting.   You have any of the following:  ? A seizure.  ? Sudden, rapid weight gain.  ? Sudden swelling in your hands, ankles, or face.  ? Trouble moving any part of your body.  ? Numbness in any part of your body.  ? Trouble speaking.  ? Abnormal bleeding.   You faint.  Summary   Preeclampsia is a serious condition that may develop during pregnancy. It is also called toxemia of pregnancy.   This  condition causes high blood pressure along with other symptoms, such as swelling and headaches.   Diagnosing and treating preeclampsia early is very important. If not treated early, it can cause serious problems for you and your baby.   Get help right away if you have symptoms that your health care provider told you to watch for.  This information is not intended to replace advice given to you by your health care provider. Make sure you discuss any questions you have with your health care provider.  Document Released: 02/09/2000 Document Revised: 01/28/2017 Document Reviewed: 09/18/2015  [employer]vier Interactive Patient Education  2019 [employer]

## 2018-05-12 ENCOUNTER — Other Ambulatory Visit: Payer: Self-pay

## 2018-05-12 ENCOUNTER — Ambulatory Visit (INDEPENDENT_AMBULATORY_CARE_PROVIDER_SITE_OTHER): Payer: BLUE CROSS/BLUE SHIELD | Admitting: Family Medicine

## 2018-05-12 VITALS — BP 122/85 | HR 82 | Wt 258.5 lb

## 2018-05-12 DIAGNOSIS — O10913 Unspecified pre-existing hypertension complicating pregnancy, third trimester: Secondary | ICD-10-CM

## 2018-05-12 DIAGNOSIS — O0990 Supervision of high risk pregnancy, unspecified, unspecified trimester: Secondary | ICD-10-CM

## 2018-05-12 DIAGNOSIS — Z3A35 35 weeks gestation of pregnancy: Secondary | ICD-10-CM

## 2018-05-12 DIAGNOSIS — O10919 Unspecified pre-existing hypertension complicating pregnancy, unspecified trimester: Secondary | ICD-10-CM

## 2018-05-12 NOTE — Progress Notes (Signed)
    PRENATAL VISIT NOTE  Subjective:  Tami Swanson is a 28 y.o. G1P0 at [redacted]w[redacted]d being seen today for ongoing prenatal care.  She is currently monitored for the following issues for this high-risk pregnancy and has Tobacco dependence syndrome; Condyloma acuminata of vulva in pregnancy, unspecified trimester; History of genital warts; Hirsutism; Supervision of high risk pregnancy, antepartum; Chronic hypertension complicating pregnancy, antepartum; Obesity (BMI 30-39.9); and Obesity in pregnancy on their problem list.  Patient reports no complaints.  Contractions: Not present.  .  Movement: Present. Denies leaking of fluid.   The following portions of the patient's history were reviewed and updated as appropriate: allergies, current medications, past family history, past medical history, past social history, past surgical history and problem list.   Objective:   Vitals:   05/12/18 1558  BP: 122/85  Pulse: 82  Weight: 258 lb 8 oz (117.3 kg)    Fetal Status: Fetal Heart Rate (bpm): nst   Movement: Present     General:  Alert, oriented and cooperative. Patient is in no acute distress.  Skin: Skin is warm and dry. No rash noted.   Cardiovascular: Normal heart rate noted  Respiratory: Normal respiratory effort, no problems with respiration noted  Abdomen: Soft, gravid, appropriate for gestational age.  Pain/Pressure: Absent     Pelvic: Cervical exam deferred        Extremities: Normal range of motion.  Edema: Trace  Mental Status: Normal mood and affect. Normal behavior. Normal judgment and thought content.  NST:  Baseline: 135 bpm, Variability: Good {> 6 bpm), Accelerations: Reactive and Decelerations: Absent   Assessment and Plan:  Pregnancy: G1P0 at [redacted]w[redacted]d 1. Chronic hypertension complicating pregnancy, antepartum BP ok today, on no meds Cotninue ASA Weekly tesing - Fetal nonstress test; Future  2. Supervision of high risk pregnancy, antepartum Cultures next visit.  Preterm labor  symptoms and general obstetric precautions including but not limited to vaginal bleeding, contractions, leaking of fluid and fetal movement were reviewed in detail with the patient. Please refer to After Visit Summary for other counseling recommendations.   Return in 1 week (on 05/19/2018).  Future Appointments  Date Time Provider Department Center  05/13/2018  1:50 PM [facility] NURSE [facility]  05/13/2018  2:00 PM [facility] [place] 3 [facility]  05/19/2018  3:00 PM Anyanwu, Jethro Bastos, MD [facility]  05/20/2018  2:50 PM [facility] NURSE [facility]  05/20/2018  3:00 PM [facility] [place] 3 [facility]  05/26/2018  8:45 AM Allie Bossier, MD [facility]  05/27/2018  2:30 PM [facility] NURSE [facility]  05/27/2018  2:45 PM [facility] [place] 5 [facility]  06/02/2018  3:00 PM Allie Bossier, MD [facility]  06/03/2018  2:50 PM [facility] NURSE [facility]  06/03/2018  3:00 PM [facility] [place] 3 [facility]  06/09/2018  3:00 PM Anyanwu, Jethro Bastos, MD [facility]    Reva Bores, MD

## 2018-05-12 NOTE — Progress Notes (Signed)
NST

## 2018-05-12 NOTE — Patient Instructions (Signed)

## 2018-05-13 ENCOUNTER — Ambulatory Visit (HOSPITAL_COMMUNITY): Payer: BLUE CROSS/BLUE SHIELD | Admitting: *Deleted

## 2018-05-13 ENCOUNTER — Encounter (HOSPITAL_COMMUNITY): Payer: Self-pay | Admitting: *Deleted

## 2018-05-13 ENCOUNTER — Ambulatory Visit (HOSPITAL_COMMUNITY)
Admission: RE | Admit: 2018-05-13 | Discharge: 2018-05-13 | Disposition: A | Discharge status: Home / Self Care | Payer: BLUE CROSS/BLUE SHIELD | Source: Ambulatory Visit | Attending: Obstetrics and Gynecology | Admitting: Obstetrics and Gynecology

## 2018-05-13 VITALS — BP 139/76 | HR 92 | Temp 98.9°F | Wt 257.0 lb

## 2018-05-13 DIAGNOSIS — O99332 Smoking (tobacco) complicating pregnancy, second trimester: Secondary | ICD-10-CM | POA: Diagnosis not present

## 2018-05-13 DIAGNOSIS — O288 Other abnormal findings on antenatal screening of mother: Secondary | ICD-10-CM

## 2018-05-13 DIAGNOSIS — O10919 Unspecified pre-existing hypertension complicating pregnancy, unspecified trimester: Secondary | ICD-10-CM | POA: Diagnosis not present

## 2018-05-13 DIAGNOSIS — O99212 Obesity complicating pregnancy, second trimester: Secondary | ICD-10-CM | POA: Diagnosis not present

## 2018-05-13 DIAGNOSIS — I10 Essential (primary) hypertension: Secondary | ICD-10-CM

## 2018-05-13 DIAGNOSIS — Z3A35 35 weeks gestation of pregnancy: Secondary | ICD-10-CM

## 2018-05-19 ENCOUNTER — Ambulatory Visit (INDEPENDENT_AMBULATORY_CARE_PROVIDER_SITE_OTHER): Payer: BLUE CROSS/BLUE SHIELD | Admitting: Obstetrics & Gynecology

## 2018-05-19 ENCOUNTER — Other Ambulatory Visit (HOSPITAL_COMMUNITY)
Admission: RE | Admit: 2018-05-19 | Discharge: 2018-05-19 | Disposition: A | Discharge status: Home / Self Care | Payer: BLUE CROSS/BLUE SHIELD | Source: Ambulatory Visit | Attending: Obstetrics & Gynecology | Admitting: Obstetrics & Gynecology

## 2018-05-19 ENCOUNTER — Other Ambulatory Visit: Payer: Self-pay

## 2018-05-19 VITALS — BP 139/86 | HR 72 | Wt 257.2 lb

## 2018-05-19 DIAGNOSIS — O0993 Supervision of high risk pregnancy, unspecified, third trimester: Secondary | ICD-10-CM | POA: Diagnosis not present

## 2018-05-19 DIAGNOSIS — O10913 Unspecified pre-existing hypertension complicating pregnancy, third trimester: Secondary | ICD-10-CM

## 2018-05-19 DIAGNOSIS — Z3A36 36 weeks gestation of pregnancy: Secondary | ICD-10-CM

## 2018-05-19 DIAGNOSIS — O10919 Unspecified pre-existing hypertension complicating pregnancy, unspecified trimester: Secondary | ICD-10-CM

## 2018-05-19 LAB — OB RESULTS CONSOLE GBS: GBS: NEGATIVE

## 2018-05-19 LAB — OB RESULTS CONSOLE GC/CHLAMYDIA: Gonorrhea: NEGATIVE

## 2018-05-19 NOTE — Progress Notes (Signed)
PRENATAL VISIT NOTE  Subjective:  Tami Swanson is a 28 y.o. G1P0 at [redacted]w[redacted]d being seen today for ongoing prenatal care.  She is currently monitored for the following issues for this high-risk pregnancy and has Tobacco dependence syndrome; Condyloma acuminata of vulva in pregnancy, unspecified trimester; History of genital warts; Hirsutism; Supervision of high risk pregnancy, antepartum; Chronic hypertension complicating pregnancy, antepartum; Obesity (BMI 30-39.9); and Obesity in pregnancy on their problem list.  Patient reports no complaints.  Contractions: Irritability. Vag. Bleeding: None.  Movement: Present. Denies leaking of fluid.   The following portions of the patient's history were reviewed and updated as appropriate: allergies, current medications, past family history, past medical history, past social history, past surgical history and problem list.   Objective:   Vitals:   05/19/18 1527  BP: 139/86  Pulse: 72  Weight: 257 lb 3.2 oz (116.7 kg)    Fetal Status: Fetal Heart Rate (bpm): nst    Movement: Present  Presentation: Vertex  General:  Alert, oriented and cooperative. Patient is in no acute distress.  Skin: Skin is warm and dry. No rash noted.   Cardiovascular: Normal heart rate noted  Respiratory: Normal respiratory effort, no problems with respiration noted  Abdomen: Soft, gravid, appropriate for gestational age.  Pain/Pressure: Absent     Pelvic: Cervical exam performed Dilation: Closed Effacement (%): Thick Station: Ballotable  Extremities: Normal range of motion.  Edema: None  Mental Status: Normal mood and affect. Normal behavior. Normal judgment and thought content.   Imaging: [place] Mfm Fetal Bpp Wo Non Stress  Result Date: 05/13/2018 ----------------------------------------------------------------------  OBSTETRICS REPORT                       (Signed Final 05/13/2018 03:15 pm) ---------------------------------------------------------------------- Patient Info   ID #:       [id number]                          D.O.B.:  06/16/1990 (27 yrs)  Name:       Tami Swanson                     Visit Date: 05/13/2018 02:20 pm ---------------------------------------------------------------------- Performed By  Performed By:     Lenise Arena        Ref. Address:     [address]                    [address]                                                             [address]  Attending:        Noralee Space MD        [facility]  Referred By:      Mila Merry  Center for                    [facility]                    Healthcare ---------------------------------------------------------------------- Orders   #  Description                          Code         Ordered By   1  [place] MFM FETAL BPP WO NON              76819.01     [facility]      STRESS  ----------------------------------------------------------------------   #  Order #                    Accession #                 Episode #   1  [id number]                  [id number]                  [id number]  ---------------------------------------------------------------------- Indications   Hypertension - Chronic/Pre-existing            O10.019   Smoking complicating pregnancy, third          Q65.784   trimester   Obesity complicating pregnancy, third          O99.212   trimester   Other abnormal finding on antenatal            O28.8   screening of mother (Insufficient fetal DNA,   FF 1.1%) Declined GC   [redacted] weeks gestation of pregnancy                Z3A.35  ---------------------------------------------------------------------- Vital Signs  Weight (lb): 257                               Height:        5'6"  BMI:         41.48 ---------------------------------------------------------------------- Fetal Evaluation  Num Of Fetuses:         1  Fetal Heart Rate(bpm):  137  Cardiac Activity:       Observed  Presentation:            Cephalic  Placenta:               Posterior  Amniotic Fluid  AFI FV:      Within normal limits  AFI Sum(cm)     %Tile       Largest Pocket(cm)  21.49           81          6.28  RUQ(cm)       RLQ(cm)       LUQ(cm)        LLQ(cm)  6.28          5.12          5.34           4.75 ---------------------------------------------------------------------- Biophysical Evaluation  Amniotic F.V:   Within normal limits       F. Tone:        Observed  F. Movement:    Observed  Score:          8/8  F. Breathing:   Observed ---------------------------------------------------------------------- OB History  Gravidity:    1 ---------------------------------------------------------------------- Gestational Age  LMP:           35w 3d        Date:  09/07/17                 EDD:   06/14/18  Best:          Consuello Closs 3d     Det. By:  LMP  (09/07/17)          EDD:   06/14/18 ---------------------------------------------------------------------- Anatomy  Thoracic:              Appears normal         Kidneys:                Appear normal  Stomach:               Appears normal, left   Bladder:                Appears normal                         sided ---------------------------------------------------------------------- Cervix Uterus Adnexa  Cervix  Not visualized (advanced GA >24wks) ---------------------------------------------------------------------- Impression  Amniotic fluid is normal and good fetal activity is seen.  Antenatal testing is reassuring. BPP 8/8. ---------------------------------------------------------------------- Recommendations  Continue weekly antenatal testing till delivery. ----------------------------------------------------------------------                  Noralee Space, MD Electronically Signed Final Report   05/13/2018 03:15 pm ----------------------------------------------------------------------  [place] Mfm Fetal Bpp Wo Non Stress  Result Date:  05/06/2018 ----------------------------------------------------------------------  OBSTETRICS REPORT                         (Signed Final 05/06/2018 11:28 am) ---------------------------------------------------------------------- Patient Info  ID #:        [id number]                          D.O.B.:  October 17, 1990 (27 yrs)  Name:        Tami Swanson                     Visit Date: 05/06/2018 10:29 am ---------------------------------------------------------------------- Performed By  Performed By:      Lenise Arena        Ref. Address:      [address]                     [address]                                      [address]  Attending:         Lin Landsman      Location[facility]                     MD                                        Fetal Care  Referred By:  [facility]                     for [employer] ---------------------------------------------------------------------- Orders   #  Description                           Code         Ordered By   1  [place] MFM OB FOLLOW UP                   [phone]     Lin Landsman   2  [place] MFM FETAL BPP WO NON               [id number]     [facility]      STRESS  ----------------------------------------------------------------------   #  Order #                     Accession #                 Episode #   1  [id number]                   [id number]                  [id number]   2  [id number]                   [id number]                  [id number]  ---------------------------------------------------------------------- Indications   Hypertension - Chronic/Pre-existing             O10.019   Smoking complicating pregnancy, third           V56.433   trimester   Obesity complicating pregnancy, third trimester O99.212   Other abnormal finding on antenatal screening   O28.8   of mother (Insufficient fetal DNA, FF 1.1%)   Declined GC   [redacted] weeks gestation of pregnancy                 Z3A.34   ---------------------------------------------------------------------- Vital Signs  Weight (lb):  258                               Height:        5'6"  BMI:          41.64 ---------------------------------------------------------------------- Fetal Evaluation  Num Of Fetuses:          1  Fetal Heart Rate(bpm):   162  Cardiac Activity:        Observed  Presentation:            Cephalic  Placenta:                Posterior  P. Cord Insertion:       Previously Visualized  Amniotic Fluid  AFI FV:      Within normal limits  AFI Sum(cm)     %Tile       Largest Pocket(cm)  20.44           77          7.84  RUQ(cm)       RLQ(cm)        LUQ(cm)  LLQ(cm)  1.9           4.99           7.84           5.71 ---------------------------------------------------------------------- Biophysical Evaluation  Amniotic F.V:   Within normal limits        F. Tone:         Observed  F. Movement:    Observed                    Score:           8/8  F. Breathing:   Observed ---------------------------------------------------------------------- Biometry  BPD:      91.5   mm     G. Age:  37w 1d         98  %    CI:         76.11  %    70 - 86                                                           FL/HC:       20.4  %    19.4 - 21.8  HC:      332.4   mm     G. Age:  38w 0d         92  %    HC/AC:       1.03       0.96 - 1.11  AC:      323.2   mm     G. Age:  36w 2d         93  %    FL/BPD:      74.0  %    71 - 87  FL:       67.7   mm     G. Age:  34w 5d         51  %    FL/AC:       20.9  %    20 - 24  Est. FW:    2861   gm      6 lb 5 oz     86  % ---------------------------------------------------------------------- OB History  Gravidity:     1 ---------------------------------------------------------------------- Gestational Age  LMP:            34w 3d       Date:  09/07/17                   EDD:  06/14/18  U/S Today:      36w 4d                                         EDD:  05/30/18  Best:           34w 3d    Det. By:  LMP  (09/07/17)             EDD:  06/14/18 ---------------------------------------------------------------------- Anatomy  Cranium:                Appears normal         LVOT:  Previously seen  Cavum:                  Previously seen        Aortic Arch:            Previously seen  Ventricles:             Previously seen        Ductal Arch:            Previously seen  Choroid Plexus:         Previously seen        Diaphragm:              Previously seen  Cerebellum:             Previously seen        Stomach:                Appears normal, left                                                                         sided  Posterior Fossa:        Previously seen        Abdomen:                Appears normal  Nuchal Fold:            Previously seen        Abdominal Wall:         Previously seen  Face:                   Orbits appear normal   Cord Vessels:           Previously seen  Lips:                   Previously seen        Kidneys:                Appear normal  Palate:                 Previously seen        Bladder:                Appears normal  Thoracic:               Appears normal         Spine:                  Previously seen  Heart:                  Previously seen        Upper Extremities:      Previously seen  RVOT:                   Previously seen        Lower Extremities:      Previously seen  Other:   Heels and 5th digit previously seen. Nasal bone previously seen. ---------------------------------------------------------------------- Cervix Uterus Adnexa  Cervix  Not visualized (advanced GA >24wks) ---------------------------------------------------------------------- Impression  Normal interval growth.  Biophysical profile 8/8  Chronic Hypertension.  BMI 40 ----------------------------------------------------------------------  Recommendations  Continue weekly testing  Follow up growth in 4 weeks. ----------------------------------------------------------------------               Lin Landsman,  MD Electronically Signed Final Report   05/06/2018 11:28 am ----------------------------------------------------------------------  [place] Mfm Fetal Bpp Wo Non Stress  Result Date: 04/22/2018 ----------------------------------------------------------------------  OBSTETRICS REPORT                       (Signed Final 04/22/2018 05:23 pm) ---------------------------------------------------------------------- Patient Info  ID #:       [id number]                          D.O.B.:  November 27, 1990 (27 yrs)  Name:       Tami Swanson                     Visit Date: 04/22/2018 04:12 pm ---------------------------------------------------------------------- Performed By  Performed By:     Apolonio Schneiders Sweat         Ref. Address:     [address]                    [address]                                                             [address]  Attending:        Noralee Space MD        Location[facility]  Referred By:      [facility] for                    [facility]                    Healthcare ---------------------------------------------------------------------- Orders   #  Description                          Code         Ordered By   1  [place] MFM FETAL BPP WO NON              40981.19     [facility]      STRESS  ----------------------------------------------------------------------   #  Order #                    Accession #                 Episode #   1  [id number]                  [id number]  [id number]  ---------------------------------------------------------------------- Indications   Hypertension - Chronic/Pre-existing            O10.019   Smoking complicating pregnancy, third          O99.332   trimester   Obesity complicating pregnancy, third          O99.212   trimester   Other abnormal finding on antenatal            O28.8   screening of mother (Insufficient fetal DNA,   FF 1.1%) Declined GC   [redacted] weeks  gestation of pregnancy                Z3A.32  ---------------------------------------------------------------------- Fetal Evaluation  Num Of Fetuses:         1  Fetal Heart Rate(bpm):  146  Cardiac Activity:       Observed  Presentation:           Cephalic  Placenta:               Posterior  P. Cord Insertion:      Previously Visualized  Amniotic Fluid  AFI FV:      Within normal limits  AFI Sum(cm)     %Tile       Largest Pocket(cm)  14.97           53          6.2  RUQ(cm)       RLQ(cm)       LUQ(cm)        LLQ(cm)  6.2           2.75          3.06           2.96 ---------------------------------------------------------------------- Biophysical Evaluation  Amniotic F.V:   Within normal limits       F. Tone:        Observed  F. Movement:    Observed                   Score:          8/8  F. Breathing:   Observed ---------------------------------------------------------------------- OB History  Gravidity:    1 ---------------------------------------------------------------------- Gestational Age  LMP:           32w 3d        Date:  09/07/17                 EDD:   06/14/18  Best:          Armida Sans 3d     Det. By:  LMP  (09/07/17)          EDD:   06/14/18 ---------------------------------------------------------------------- Anatomy  Cranium:               Previously seen        LVOT:                   Previously seen  Cavum:                 Previously seen        Aortic Arch:            Previously seen  Ventricles:            Previously seen        Ductal Arch:            Previously seen  Choroid Plexus:        Previously  seen        Diaphragm:              Previously seen  Cerebellum:            Previously seen        Stomach:                Appears normal, left                                                                        sided  Posterior Fossa:       Previously seen        Abdomen:                Appears normal  Nuchal Fold:           Previously seen        Abdominal Wall:         Previously seen  Face:                   Orbits appear          Cord Vessels:           Previously seen                         normal  Lips:                  Previously seen        Kidneys:                Appear normal  Palate:                Previously seen        Bladder:                Appears normal  Thoracic:              Previously seen        Spine:                  Previously seen  Heart:                 Previously seen        Upper Extremities:      Previously seen  RVOT:                  Previously seen        Lower Extremities:      Previously seen  Other:  Heels and 5th digit previously seen. Nasal bone previously seen. ---------------------------------------------------------------------- Impression  Amniotic fluid is normal and good fetal activity is seen.  Antenatal testing is reassuring. BPP 8/8.  Patient has chronic hypertension that is well-controlled  without medications. ---------------------------------------------------------------------- Recommendations  -We recommend weekly antenatal testing from 36 weeks till  delivery.  -Fetal growth assessment in 2 weeks. ----------------------------------------------------------------------                  Noralee Space, MD Electronically Signed Final Report   04/22/2018 05:23 pm ----------------------------------------------------------------------  [place] Mfm Ob Follow Up  Result Date: 05/06/2018 ----------------------------------------------------------------------  OBSTETRICS REPORT                         (  Signed Final 05/06/2018 11:28 am) ---------------------------------------------------------------------- Patient Info  ID #:        [id number]                          D.O.B.:  [birth year] (27 yrs)  Name:        Tami Swanson                     Visit Date: 05/06/2018 10:29 am ---------------------------------------------------------------------- Performed By  Performed By:      Lenise Arena        Ref. Address:      [address]                     [address]                                       [address]  Attending:         Lin Landsman      Location[facility]                     MD                                        Fetal Care  Referred By:       [facility]                     for [facility]                     Healthcare ---------------------------------------------------------------------- Orders   #  Description                           Code         Ordered By   1  [place] MFM OB FOLLOW UP                   [id number]     Lin Landsman   2  [place] MFM FETAL BPP WO NON               76819.01     [facility]      STRESS  ----------------------------------------------------------------------   #  Order #                     Accession #                 Episode #   1  [id number]                   [id number]                  [id number]   2  [id number]                   [id number]                  [id number]  ---------------------------------------------------------------------- Indications   Hypertension - Chronic/Pre-existing             O10.019   Smoking complicating pregnancy, third  O03.559   trimester   Obesity complicating pregnancy, third trimester O99.212   Other abnormal finding on antenatal screening   O28.8   of mother (Insufficient fetal DNA, FF 1.1%)   Declined GC   [redacted] weeks gestation of pregnancy                 Z3A.34  ---------------------------------------------------------------------- Vital Signs  Weight (lb):  258                               Height:        5'6"  BMI:          41.64 ---------------------------------------------------------------------- Fetal Evaluation  Num Of Fetuses:          1  Fetal Heart Rate(bpm):   162  Cardiac Activity:        Observed  Presentation:            Cephalic  Placenta:                Posterior  P. Cord Insertion:       Previously Visualized  Amniotic Fluid  AFI FV:      Within normal limits  AFI Sum(cm)     %Tile       Largest Pocket(cm)  20.44           77          7.84  RUQ(cm)       RLQ(cm)         LUQ(cm)        LLQ(cm)  1.9           4.99           7.84           5.71 ---------------------------------------------------------------------- Biophysical Evaluation  Amniotic F.V:   Within normal limits        F. Tone:         Observed  F. Movement:    Observed                    Score:           8/8  F. Breathing:   Observed ---------------------------------------------------------------------- Biometry  BPD:      91.5   mm     G. Age:  37w 1d         98  %    CI:         76.11  %    70 - 86                                                           FL/HC:       20.4  %    19.4 - 21.8  HC:      332.4   mm     G. Age:  38w 0d         92  %    HC/AC:       1.03       0.96 - 1.11  AC:      323.2   mm     G. Age:  36w 2d         93  %    FL/BPD:  74.0  %    71 - 87  FL:       67.7   mm     G. Age:  34w 5d         51  %    FL/AC:       20.9  %    20 - 24  Est. FW:    2861   gm      6 lb 5 oz     86  % ---------------------------------------------------------------------- OB History  Gravidity:     1 ---------------------------------------------------------------------- Gestational Age  LMP:            34w 3d       Date:  09/07/17                   EDD:  06/14/18  U/S Today:      36w 4d                                         EDD:  05/30/18  Best:           34w 3d    Det. By:  LMP  (09/07/17)            EDD:  06/14/18 ---------------------------------------------------------------------- Anatomy  Cranium:                Appears normal         LVOT:                   Previously seen  Cavum:                  Previously seen        Aortic Arch:            Previously seen  Ventricles:             Previously seen        Ductal Arch:            Previously seen  Choroid Plexus:         Previously seen        Diaphragm:              Previously seen  Cerebellum:             Previously seen        Stomach:                Appears normal, left                                                                         sided   Posterior Fossa:        Previously seen        Abdomen:                Appears normal  Nuchal Fold:            Previously seen        Abdominal Wall:         Previously seen  Face:  Orbits appear normal   Cord Vessels:           Previously seen  Lips:                   Previously seen        Kidneys:                Appear normal  Palate:                 Previously seen        Bladder:                Appears normal  Thoracic:               Appears normal         Spine:                  Previously seen  Heart:                  Previously seen        Upper Extremities:      Previously seen  RVOT:                   Previously seen        Lower Extremities:      Previously seen  Other:   Heels and 5th digit previously seen. Nasal bone previously seen. ---------------------------------------------------------------------- Cervix Uterus Adnexa  Cervix  Not visualized (advanced GA >24wks) ---------------------------------------------------------------------- Impression  Normal interval growth.  Biophysical profile 8/8  Chronic Hypertension.  BMI 40 ---------------------------------------------------------------------- Recommendations  Continue weekly testing  Follow up growth in 4 weeks. ----------------------------------------------------------------------               Lin Landsman, MD Electronically Signed Final Report   05/06/2018 11:28 am ----------------------------------------------------------------------   Assessment and Plan:  Pregnancy: G1P0 at [redacted]w[redacted]d 1. Chronic hypertension complicating pregnancy, antepartum Stable BP today. Fetal nonstress test performed today was reviewed and was found to be reactive.  Continue recommended antenatal testing and prenatal care.  2. Supervision of high risk pregnancy in third trimester Pelvic cultures done today. - Culture, beta strep (group b only) - Cervicovaginal ancillary only( [place]) Preterm labor symptoms and general obstetric  precautions including but not limited to vaginal bleeding, contractions, leaking of fluid and fetal movement were reviewed in detail with the patient. Please refer to After Visit Summary for other counseling recommendations.   Return for OB visits and antenatal testing as scheduled.  Future Appointments  Date Time Provider Department Center  05/20/2018  3:00 PM [facility] NURSE [facility]  05/20/2018  3:00 PM [facility] [place] 3 [facility]-US  05/26/2018  8:45 AM Allie Bossier, MD [facility]  05/27/2018  2:45 PM [facility] NURSE [facility]-US  05/27/2018  2:45 PM [facility] [place] 5 WH-MFCUS M[facility]-US  06/02/2018  3:00 PM Allie Bossier, MD [facility]  06/03/2018  3:00 PM [facility] NURSE [facility]  06/03/2018  3:00 PM [facility] [place] 3 [facility]  06/09/2018  3:00 PM Hamdan Toscano, Jethro Bastos, MD [facility]    Jaynie Collins, MD

## 2018-05-20 ENCOUNTER — Encounter (HOSPITAL_COMMUNITY): Payer: Self-pay

## 2018-05-20 ENCOUNTER — Ambulatory Visit (HOSPITAL_COMMUNITY)
Admission: RE | Admit: 2018-05-20 | Discharge: 2018-05-20 | Disposition: A | Discharge status: Home / Self Care | Payer: BLUE CROSS/BLUE SHIELD | Source: Ambulatory Visit | Attending: Obstetrics and Gynecology | Admitting: Obstetrics and Gynecology

## 2018-05-20 ENCOUNTER — Ambulatory Visit (HOSPITAL_COMMUNITY): Payer: BLUE CROSS/BLUE SHIELD | Admitting: *Deleted

## 2018-05-20 ENCOUNTER — Other Ambulatory Visit (HOSPITAL_COMMUNITY): Payer: Self-pay | Admitting: *Deleted

## 2018-05-20 VITALS — BP 131/82 | HR 93 | Temp 98.5°F

## 2018-05-20 DIAGNOSIS — O10913 Unspecified pre-existing hypertension complicating pregnancy, third trimester: Secondary | ICD-10-CM

## 2018-05-20 DIAGNOSIS — O99213 Obesity complicating pregnancy, third trimester: Secondary | ICD-10-CM

## 2018-05-20 DIAGNOSIS — O288 Other abnormal findings on antenatal screening of mother: Secondary | ICD-10-CM | POA: Diagnosis not present

## 2018-05-20 DIAGNOSIS — O10919 Unspecified pre-existing hypertension complicating pregnancy, unspecified trimester: Secondary | ICD-10-CM | POA: Diagnosis not present

## 2018-05-20 DIAGNOSIS — Z3A36 36 weeks gestation of pregnancy: Secondary | ICD-10-CM

## 2018-05-20 DIAGNOSIS — O99333 Smoking (tobacco) complicating pregnancy, third trimester: Secondary | ICD-10-CM

## 2018-05-21 LAB — CERVICOVAGINAL ANCILLARY ONLY
Chlamydia: NEGATIVE
Neisseria Gonorrhea: NEGATIVE

## 2018-05-23 LAB — CULTURE, BETA STREP (GROUP B ONLY): Strep Gp B Culture: NEGATIVE

## 2018-05-26 ENCOUNTER — Ambulatory Visit (INDEPENDENT_AMBULATORY_CARE_PROVIDER_SITE_OTHER): Payer: BLUE CROSS/BLUE SHIELD | Admitting: Obstetrics & Gynecology

## 2018-05-26 ENCOUNTER — Other Ambulatory Visit: Payer: Self-pay

## 2018-05-26 VITALS — BP 130/85 | HR 98 | Wt 258.0 lb

## 2018-05-26 DIAGNOSIS — O0990 Supervision of high risk pregnancy, unspecified, unspecified trimester: Secondary | ICD-10-CM

## 2018-05-26 DIAGNOSIS — O99213 Obesity complicating pregnancy, third trimester: Secondary | ICD-10-CM

## 2018-05-26 DIAGNOSIS — O99210 Obesity complicating pregnancy, unspecified trimester: Secondary | ICD-10-CM

## 2018-05-26 DIAGNOSIS — O10913 Unspecified pre-existing hypertension complicating pregnancy, third trimester: Secondary | ICD-10-CM

## 2018-05-26 DIAGNOSIS — O10919 Unspecified pre-existing hypertension complicating pregnancy, unspecified trimester: Secondary | ICD-10-CM

## 2018-05-26 DIAGNOSIS — Z3A37 37 weeks gestation of pregnancy: Secondary | ICD-10-CM

## 2018-05-26 NOTE — Progress Notes (Signed)
   PRENATAL VISIT NOTE  Subjective:  Tami Swanson is a 28 y.o. G1P0 at [redacted]w[redacted]d being seen today for ongoing prenatal care.  She is currently monitored for the following issues for this high-risk pregnancy and has Tobacco dependence syndrome; Condyloma acuminata of vulva in pregnancy, unspecified trimester; History of genital warts; Hirsutism; Supervision of high risk pregnancy, antepartum; Chronic hypertension complicating pregnancy, antepartum; Obesity (BMI 30-39.9); and Obesity in pregnancy on their problem list.  Patient reports no complaints.  Contractions: Not present. Vag. Bleeding: None.  Movement: Present. Denies leaking of fluid.   The following portions of the patient's history were reviewed and updated as appropriate: allergies, current medications, past family history, past medical history, past social history, past surgical history and problem list.   Objective:   Vitals:   05/26/18 0846  BP: 130/85  Pulse: 98  Weight: 258 lb (117 kg)    Fetal Status: Fetal Heart Rate (bpm): NST   Movement: Present     General:  Alert, oriented and cooperative. Patient is in no acute distress.  Skin: Skin is warm and dry. No rash noted.   Cardiovascular: Normal heart rate noted  Respiratory: Normal respiratory effort, no problems with respiration noted  Abdomen: Soft, gravid, appropriate for gestational age.  Pain/Pressure: Present     Pelvic: Cervical exam deferred        Extremities: Normal range of motion.  Edema: None  Mental Status: Normal mood and affect. Normal behavior. Normal judgment and thought content.   Assessment and Plan:  Pregnancy: G1P0 at [redacted]w[redacted]d 1. Obesity in pregnancy   2. Chronic hypertension complicating pregnancy, antepartum - on no meds except PNVs and baby asa - weekly BPP with MFM and NST here  3. Supervision of high risk pregnancy, antepartum   Preterm labor symptoms and general obstetric precautions including but not limited to vaginal bleeding,  contractions, leaking of fluid and fetal movement were reviewed in detail with the patient. Please refer to After Visit Summary for other counseling recommendations.   No follow-ups on file.  Future Appointments  Date Time Provider Department Center  05/27/2018  2:45 PM [facility] NURSE [facility]  05/27/2018  2:45 PM [facility] [place] 5 [facility]  06/02/2018  3:00 PM Allie Bossier, MD [facility]  06/03/2018  3:00 PM [facility] NURSE [facility]  06/03/2018  3:00 PM [facility] [place] 3 [facility]  06/09/2018  3:00 PM Anyanwu, Jethro Bastos, MD [facility]    Allie Bossier, MD

## 2018-05-27 ENCOUNTER — Ambulatory Visit (HOSPITAL_COMMUNITY): Payer: BLUE CROSS/BLUE SHIELD | Admitting: *Deleted

## 2018-05-27 ENCOUNTER — Encounter (HOSPITAL_COMMUNITY): Payer: Self-pay | Admitting: *Deleted

## 2018-05-27 ENCOUNTER — Encounter (HOSPITAL_COMMUNITY): Payer: Self-pay

## 2018-05-27 ENCOUNTER — Other Ambulatory Visit: Payer: Self-pay

## 2018-05-27 ENCOUNTER — Inpatient Hospital Stay (HOSPITAL_COMMUNITY)
Admission: AD | Admit: 2018-05-27 | Discharge: 2018-05-27 | Disposition: A | Discharge status: Home / Self Care | Payer: BLUE CROSS/BLUE SHIELD | Attending: Obstetrics & Gynecology | Admitting: Obstetrics & Gynecology

## 2018-05-27 ENCOUNTER — Ambulatory Visit (HOSPITAL_BASED_OUTPATIENT_CLINIC_OR_DEPARTMENT_OTHER)
Admission: RE | Admit: 2018-05-27 | Discharge: 2018-05-27 | Disposition: A | Discharge status: Home / Self Care | Payer: BLUE CROSS/BLUE SHIELD | Source: Ambulatory Visit | Attending: Obstetrics and Gynecology | Admitting: Obstetrics and Gynecology

## 2018-05-27 VITALS — BP 147/89 | HR 99 | Temp 98.8°F

## 2018-05-27 DIAGNOSIS — Z3A37 37 weeks gestation of pregnancy: Secondary | ICD-10-CM | POA: Diagnosis not present

## 2018-05-27 DIAGNOSIS — O288 Other abnormal findings on antenatal screening of mother: Secondary | ICD-10-CM | POA: Diagnosis not present

## 2018-05-27 DIAGNOSIS — O10919 Unspecified pre-existing hypertension complicating pregnancy, unspecified trimester: Secondary | ICD-10-CM

## 2018-05-27 DIAGNOSIS — O10913 Unspecified pre-existing hypertension complicating pregnancy, third trimester: Secondary | ICD-10-CM

## 2018-05-27 DIAGNOSIS — O99213 Obesity complicating pregnancy, third trimester: Secondary | ICD-10-CM

## 2018-05-27 DIAGNOSIS — O99333 Smoking (tobacco) complicating pregnancy, third trimester: Secondary | ICD-10-CM | POA: Diagnosis not present

## 2018-05-27 DIAGNOSIS — Z87891 Personal history of nicotine dependence: Secondary | ICD-10-CM | POA: Insufficient documentation

## 2018-05-27 DIAGNOSIS — Z3689 Encounter for other specified antenatal screening: Secondary | ICD-10-CM | POA: Diagnosis not present

## 2018-05-27 LAB — COMPREHENSIVE METABOLIC PANEL
ALT: 15 U/L (ref 0–44)
AST: 17 U/L (ref 15–41)
Albumin: 2.8 g/dL — ABNORMAL LOW (ref 3.5–5.0)
Alkaline Phosphatase: 222 U/L — ABNORMAL HIGH (ref 38–126)
Anion gap: 6 (ref 5–15)
BUN: 5 mg/dL — ABNORMAL LOW (ref 6–20)
CO2: 21 mmol/L — ABNORMAL LOW (ref 22–32)
Calcium: 9.6 mg/dL (ref 8.9–10.3)
Chloride: 109 mmol/L (ref 98–111)
Creatinine, Ser: 0.63 mg/dL (ref 0.44–1.00)
GFR calc Af Amer: >60 mL/min (ref >60)
GFR calc non Af Amer: >60 mL/min (ref >60)
Glucose, Bld: 81 mg/dL (ref 70–99)
Potassium: 4.2 mmol/L (ref 3.5–5.1)
Sodium: 136 mmol/L (ref 135–145)
Total Bilirubin: 0.4 mg/dL (ref 0.3–1.2)
Total Protein: 6.6 g/dL (ref 6.5–8.1)

## 2018-05-27 LAB — CBC
HCT: 38.8 % (ref 36.0–46.0)
Hemoglobin: 13.4 g/dL (ref 12.0–15.0)
MCH: 29.9 pg (ref 26.0–34.0)
MCHC: 34.5 g/dL (ref 30.0–36.0)
MCV: 86.6 fL (ref 80.0–100.0)
Platelets: 305 10*3/uL (ref 150–400)
RBC: 4.48 MIL/uL (ref 3.87–5.11)
RDW: 12.7 % (ref 11.5–15.5)
WBC: 12.3 10*3/uL — ABNORMAL HIGH (ref 4.0–10.5)
nRBC: 0 % (ref 0.0–0.2)

## 2018-05-27 LAB — PROTEIN / CREATININE RATIO, URINE
Creatinine, Urine: 83.06 mg/dL
Protein Creatinine Ratio: 0.16 mg/mg{Cre} — ABNORMAL HIGH (ref 0.00–0.15)
Total Protein, Urine: 13 mg/dL

## 2018-05-27 NOTE — Discharge Instructions (Signed)
Hypertension During Pregnancy ° °Hypertension is also called high blood pressure. High blood pressure means that the force of your blood moving in your body is too strong. When you are pregnant, this condition should be watched carefully. It can cause problems for you and your baby. °Follow these instructions at home: °Eating and drinking ° °· Drink enough fluid to keep your pee (urine) pale yellow. °· Avoid caffeine. °Lifestyle °· Do not use any products that contain nicotine or tobacco, such as cigarettes and e-cigarettes. If you need help quitting, ask your doctor. °· Do not use alcohol or drugs. °· Avoid stress. °· Rest and get plenty of sleep. °General instructions °· Take over-the-counter and prescription medicines only as told by your doctor. °· While lying down, lie on your left side. This keeps pressure off your major blood vessels. °· While sitting or lying down, raise (elevate) your feet. Try putting some pillows under your lower legs. °· Exercise regularly. Ask your doctor what kinds of exercise are best for you. °· Keep all prenatal and follow-up visits as told by your doctor. This is important. °Contact a doctor if: °· You have symptoms that your doctor told you to watch for, such as: °? Throwing up (vomiting). °? Feeling sick to your stomach (nausea). °? Headache. °Get help right away if you have: °· Very bad belly pain that does not get better with treatment. °· A very bad headache that does not get better. °· Throwing up that does not get better with treatment. °· Sudden, fast weight gain. °· Sudden swelling in your hands, ankles, or face. °· Bleeding from your vagina. °· Blood in your pee. °· Fewer movements from your baby than usual. °· Blurry vision. °· Double vision. °· Muscle twitching. °· Sudden muscle tightening (spasms). °· Trouble breathing. °· Blue fingernails or lips. °Summary °· Hypertension is also called high blood pressure. High blood pressure means that the force of your blood moving  in your body is too strong. °· When you are pregnant, this condition should be watched carefully. It can cause problems for you and your baby. °· Get help right away if you have symptoms that your doctor told you to watch for. °This information is not intended to replace advice given to you by your health care provider. Make sure you discuss any questions you have with your health care provider. °Document Released: 03/16/2010 Document Revised: 01/28/2017 Document Reviewed: 10/24/2015 °Elsevier Interactive Patient Education © 2019 [employer] ° °

## 2018-05-27 NOTE — MAU Note (Signed)
Pt sent to MAU from MFM for  Monitoring. PT had increase in blood pressure today. Denies any HA or blurred vision. +FM

## 2018-05-27 NOTE — MAU Provider Note (Signed)
History     CSN: [id number]  Arrival date and time: 05/27/18 1554   First Provider Initiated Contact with Tami Swanson 05/27/18 1631      Chief Complaint  Tami Swanson presents with  . Hypertension   G1 @37 .3 wks with [facility] sent from MFM for elevated BPs. Denies HA, visual disturbances, RUQ pain, SOB, and CP. Reports good FM. No VB, LOF, or ctx. She is not on BP meds.     OB History    Gravida  1   Para      Term      Preterm      AB      Living  0     SAB      TAB      Ectopic      Multiple      Live Births              Past Medical History:  Diagnosis Date  . Hypertension     Past Surgical History:  Procedure Laterality Date  . FOOT SURGERY Right 2006    Family History  Problem Relation Age of Onset  . Hypertension Father   . Heart disease Maternal Grandfather     Social History   Tobacco Use  . Smoking status: Former Smoker    Packs/day: 0.25    Types: Cigarettes  . Smokeless tobacco: Former Neurosurgeon  . Tobacco comment: Only smokes one cigarette a day  Substance Use Topics  . Alcohol use: Never    Frequency: Never  . Drug use: No    Allergies: No Known Allergies  No medications prior to admission.    Review of Systems  Eyes: Negative for visual disturbance.  Respiratory: Negative for shortness of breath.   Cardiovascular: Negative for chest pain.  Gastrointestinal: Negative for abdominal pain.  Genitourinary: Negative for vaginal bleeding.  Neurological: Negative for headaches.   Physical Exam   Blood pressure 134/76, pulse 91, temperature 99.2 F (37.3 C), resp. rate 16, weight 112.9 kg, last menstrual period 09/07/2017, SpO2 99 %.  Tami Swanson Vitals for the past 24 hrs:  BP Temp Pulse Resp SpO2 Weight  05/27/18 1735 - - - - 99 % -  05/27/18 1730 - - - - 99 % -  05/27/18 1725 - - - - 98 % -  05/27/18 1720 - - - - 98 % -  05/27/18 1716 134/76 - 91 - - -  05/27/18 1705 - - - - 98 % -  05/27/18 1701 (!) 131/93 - (!) 102 - - -   05/27/18 1700 - - - - 98 % -  05/27/18 1655 - - - - 98 % -  05/27/18 1650 - - - - 98 % -  05/27/18 1646 119/86 - (!) 101 - - -  05/27/18 1645 - - - - 96 % -  05/27/18 1640 - - - - 99 % -  05/27/18 1634 - - - - 99 % -  05/27/18 1631 123/82 - (!) 101 16 - -  05/27/18 1630 - - - - 98 % -  05/27/18 1626 - - - - - 112.9 kg  05/27/18 1625 - - - - 99 % -  05/27/18 1620 - - - - 99 % -  05/27/18 1618 (!) 141/100 99.2 F (37.3 C) (!) 101 16 100 % -    Physical Exam  Constitutional: She is oriented to person, place, and time. She appears well-developed and well-nourished. No distress.  HENT:  Head: Normocephalic  and atraumatic.  Neck: Normal range of motion.  Cardiovascular: Normal rate.  Respiratory: Effort normal. No respiratory distress.  Musculoskeletal: Normal range of motion.  Neurological: She is alert and oriented to person, place, and time.  Psychiatric: She has a normal mood and affect.  EFM: 140 bpm, mod variability, + accels, no decels Toco: rare  Results for orders placed or performed during the hospital encounter of 05/27/18 (from the past 24 hour(s))  Protein / creatinine ratio, urine     Status: Abnormal   Collection Time: 05/27/18  4:29 PM  Result Value Ref Range   Creatinine, Urine 83.06 mg/dL   Total Protein, Urine 13 mg/dL   Protein Creatinine Ratio 0.16 (H) 0.00 - 0.15 mg/mg[Cre]  CBC     Status: Abnormal   Collection Time: 05/27/18  4:43 PM  Result Value Ref Range   WBC 12.3 (H) 4.0 - 10.5 K/uL   RBC 4.48 3.87 - 5.11 MIL/uL   Hemoglobin 13.4 12.0 - 15.0 g/dL   HCT 42.7 06.2 - 37.6 %   MCV 86.6 80.0 - 100.0 fL   MCH 29.9 26.0 - 34.0 pg   MCHC 34.5 30.0 - 36.0 g/dL   RDW 28.3 15.1 - 76.1 %   Platelets 305 150 - 400 K/uL   nRBC 0.0 0.0 - 0.2 %  Comprehensive metabolic panel     Status: Abnormal   Collection Time: 05/27/18  4:43 PM  Result Value Ref Range   Sodium 136 135 - 145 mmol/L   Potassium 4.2 3.5 - 5.1 mmol/L   Chloride 109 98 - 111 mmol/L   CO2  21 (L) 22 - 32 mmol/L   Glucose, Bld 81 70 - 99 mg/dL   BUN 5 (L) 6 - 20 mg/dL   Creatinine, Ser 6.07 0.44 - 1.00 mg/dL   Calcium 9.6 8.9 - 37.1 mg/dL   Total Protein 6.6 6.5 - 8.1 g/dL   Albumin 2.8 (L) 3.5 - 5.0 g/dL   AST 17 15 - 41 U/L   ALT 15 0 - 44 U/L   Alkaline Phosphatase 222 (H) 38 - 126 U/L   Total Bilirubin 0.4 0.3 - 1.2 mg/dL   GFR calc non Af Amer >60 >60 mL/min   GFR calc Af Amer >60 >60 mL/min   Anion gap 6 5 - 15   MAU Course  Procedures Orders Placed This Encounter  Procedures  . CBC    Standing Status:   Standing    Number of Occurrences:   1  . Comprehensive metabolic panel    Standing Status:   Standing    Number of Occurrences:   1  . Protein / creatinine ratio, urine    Standing Status:   Standing    Number of Occurrences:   1  . Discharge Tami Swanson    Order Specific Question:   Discharge disposition    Answer:   01-Home or Self Care [1]    Order Specific Question:   Discharge Tami Swanson date    Answer:   05/27/2018   MDM Labs ordered and reviewed. No evidence of PEC. BP stable. Stable for discharge home.   Assessment and Plan   1. [redacted] weeks gestation of pregnancy   2. NST (non-stress test) reactive   3. Chronic hypertension complicating pregnancy, antepartum    Discharge home Follow up at [facility] as scheduled PEC precautions  Allergies as of 05/27/2018   No Known Allergies     Medication List    TAKE these medications  aspirin EC 81 MG tablet Take 1 tablet (81 mg total) by mouth daily.   Comfort Fit Maternity Supp Lg Misc Wear daily when ambulating   cyclobenzaprine 10 MG tablet Commonly known as:  FLEXERIL Take 1 tablet (10 mg total) by mouth every 8 (eight) hours as needed for muscle spasms.   PRENATAL VITAMIN PO Take by mouth.      Donette Larry, CNM 05/27/2018, 8:46 PM

## 2018-05-28 ENCOUNTER — Other Ambulatory Visit (HOSPITAL_COMMUNITY): Payer: Self-pay | Admitting: *Deleted

## 2018-05-28 DIAGNOSIS — O10913 Unspecified pre-existing hypertension complicating pregnancy, third trimester: Secondary | ICD-10-CM

## 2018-06-01 ENCOUNTER — Ambulatory Visit (HOSPITAL_COMMUNITY)
Admission: RE | Admit: 2018-06-01 | Discharge: 2018-06-01 | Disposition: A | Discharge status: Home / Self Care | Payer: BLUE CROSS/BLUE SHIELD | Source: Ambulatory Visit | Attending: Obstetrics and Gynecology | Admitting: Obstetrics and Gynecology

## 2018-06-01 ENCOUNTER — Other Ambulatory Visit: Payer: Self-pay

## 2018-06-01 ENCOUNTER — Encounter (HOSPITAL_COMMUNITY): Payer: Self-pay | Admitting: *Deleted

## 2018-06-01 ENCOUNTER — Other Ambulatory Visit (HOSPITAL_COMMUNITY): Payer: Self-pay | Admitting: *Deleted

## 2018-06-01 ENCOUNTER — Ambulatory Visit (HOSPITAL_COMMUNITY): Payer: BLUE CROSS/BLUE SHIELD | Admitting: *Deleted

## 2018-06-01 VITALS — BP 126/83 | HR 109 | Temp 98.5°F

## 2018-06-01 DIAGNOSIS — Z362 Encounter for other antenatal screening follow-up: Secondary | ICD-10-CM | POA: Diagnosis not present

## 2018-06-01 DIAGNOSIS — O99333 Smoking (tobacco) complicating pregnancy, third trimester: Secondary | ICD-10-CM

## 2018-06-01 DIAGNOSIS — Z3A38 38 weeks gestation of pregnancy: Secondary | ICD-10-CM

## 2018-06-01 DIAGNOSIS — O99213 Obesity complicating pregnancy, third trimester: Secondary | ICD-10-CM

## 2018-06-01 DIAGNOSIS — O10913 Unspecified pre-existing hypertension complicating pregnancy, third trimester: Secondary | ICD-10-CM

## 2018-06-01 DIAGNOSIS — O288 Other abnormal findings on antenatal screening of mother: Secondary | ICD-10-CM | POA: Diagnosis not present

## 2018-06-01 DIAGNOSIS — O10919 Unspecified pre-existing hypertension complicating pregnancy, unspecified trimester: Secondary | ICD-10-CM | POA: Insufficient documentation

## 2018-06-01 DIAGNOSIS — I10 Essential (primary) hypertension: Secondary | ICD-10-CM | POA: Insufficient documentation

## 2018-06-02 ENCOUNTER — Ambulatory Visit (INDEPENDENT_AMBULATORY_CARE_PROVIDER_SITE_OTHER): Payer: BLUE CROSS/BLUE SHIELD | Admitting: Obstetrics & Gynecology

## 2018-06-02 ENCOUNTER — Encounter: Payer: BLUE CROSS/BLUE SHIELD | Admitting: Obstetrics & Gynecology

## 2018-06-02 ENCOUNTER — Encounter: Payer: Self-pay | Admitting: *Deleted

## 2018-06-02 VITALS — BP 137/98 | HR 109

## 2018-06-02 DIAGNOSIS — Z3A38 38 weeks gestation of pregnancy: Secondary | ICD-10-CM | POA: Diagnosis not present

## 2018-06-02 DIAGNOSIS — O10919 Unspecified pre-existing hypertension complicating pregnancy, unspecified trimester: Secondary | ICD-10-CM

## 2018-06-02 DIAGNOSIS — Z029 Encounter for administrative examinations, unspecified: Secondary | ICD-10-CM

## 2018-06-02 DIAGNOSIS — O99210 Obesity complicating pregnancy, unspecified trimester: Secondary | ICD-10-CM

## 2018-06-02 DIAGNOSIS — O10913 Unspecified pre-existing hypertension complicating pregnancy, third trimester: Secondary | ICD-10-CM | POA: Diagnosis not present

## 2018-06-02 DIAGNOSIS — O99213 Obesity complicating pregnancy, third trimester: Secondary | ICD-10-CM

## 2018-06-02 DIAGNOSIS — O0993 Supervision of high risk pregnancy, unspecified, third trimester: Secondary | ICD-10-CM

## 2018-06-02 DIAGNOSIS — O0990 Supervision of high risk pregnancy, unspecified, unspecified trimester: Secondary | ICD-10-CM

## 2018-06-02 NOTE — Progress Notes (Signed)
   PRENATAL VISIT NOTE  Subjective:  Tami Swanson is a 28 y.o. G1P0 at [redacted]w[redacted]d being seen today for ongoing prenatal care.  She is currently monitored for the following issues for this high-risk pregnancy and has Tobacco dependence syndrome; Condyloma acuminata of vulva in pregnancy, unspecified trimester; History of genital warts; Hirsutism; Supervision of high risk pregnancy, antepartum; Chronic hypertension complicating pregnancy, antepartum; Obesity (BMI 30-39.9); and Obesity in pregnancy on their problem list.  Patient reports no complaints.  Contractions: Irritability. Vag. Bleeding: None.  Movement: Present. Denies leaking of fluid.   The following portions of the patient's history were reviewed and updated as appropriate: allergies, current medications, past family history, past medical history, past social history, past surgical history and problem list.   Objective:   Vitals:   06/02/18 0847  BP: (!) 137/98  Pulse: (!) 109    Fetal Status: Fetal Heart Rate (bpm): NST   Movement: Present     General:  Alert, oriented and cooperative. Patient is in no acute distress.  Skin: Skin is warm and dry. No rash noted.   Cardiovascular: Normal heart rate noted  Respiratory: Normal respiratory effort, no problems with respiration noted  Abdomen: Soft, gravid, appropriate for gestational age.  Pain/Pressure: Present     Pelvic: Cervical exam performed        Extremities: Normal range of motion.  Edema: None  Mental Status: Normal mood and affect. Normal behavior. Normal judgment and thought content.   Assessment and Plan:  Pregnancy: G1P0 at [redacted]w[redacted]d 1. Supervision of high risk pregnancy, antepartum   2. Chronic hypertension complicating pregnancy, antepartum - schedule IOL - We discussed that this may take 3 days  3. Obesity in pregnancy   Term labor symptoms and general obstetric precautions including but not limited to vaginal bleeding, contractions, leaking of fluid and fetal  movement were reviewed in detail with the patient. Please refer to After Visit Summary for other counseling recommendations.   No follow-ups on file.  Future Appointments  Date Time Provider Department Center  06/08/2018 12:45 PM [facility] NURSE [facility] MFC-US  06/08/2018 12:45 PM [facility] [place] 2 [facility]-M[facility] MFC-US  06/09/2018 11:00 AM Anyanwu, Jethro Bastos, MD [facility]    Allie Bossier, MD

## 2018-06-03 ENCOUNTER — Ambulatory Visit (HOSPITAL_COMMUNITY): Payer: BLUE CROSS/BLUE SHIELD

## 2018-06-03 ENCOUNTER — Telehealth (HOSPITAL_COMMUNITY): Payer: Self-pay | Admitting: *Deleted

## 2018-06-03 ENCOUNTER — Encounter (HOSPITAL_COMMUNITY): Payer: Self-pay | Admitting: *Deleted

## 2018-06-03 ENCOUNTER — Encounter (HOSPITAL_COMMUNITY): Payer: Self-pay

## 2018-06-03 NOTE — Telephone Encounter (Signed)
Preadmission screen  

## 2018-06-05 ENCOUNTER — Other Ambulatory Visit (HOSPITAL_COMMUNITY): Payer: Self-pay | Admitting: *Deleted

## 2018-06-07 ENCOUNTER — Encounter (HOSPITAL_COMMUNITY): Payer: Self-pay

## 2018-06-07 ENCOUNTER — Other Ambulatory Visit: Payer: Self-pay

## 2018-06-07 ENCOUNTER — Inpatient Hospital Stay (HOSPITAL_COMMUNITY): Payer: BLUE CROSS/BLUE SHIELD

## 2018-06-07 ENCOUNTER — Inpatient Hospital Stay (HOSPITAL_COMMUNITY)
Admission: AD | Admit: 2018-06-07 | Discharge: 2018-06-13 | DRG: 788 | Disposition: A | Discharge status: Home / Self Care | Payer: BLUE CROSS/BLUE SHIELD | Attending: Obstetrics and Gynecology | Admitting: Obstetrics and Gynecology

## 2018-06-07 DIAGNOSIS — Z23 Encounter for immunization: Secondary | ICD-10-CM | POA: Diagnosis not present

## 2018-06-07 DIAGNOSIS — O99214 Obesity complicating childbirth: Secondary | ICD-10-CM | POA: Diagnosis present

## 2018-06-07 DIAGNOSIS — O114 Pre-existing hypertension with pre-eclampsia, complicating childbirth: Secondary | ICD-10-CM | POA: Diagnosis not present

## 2018-06-07 DIAGNOSIS — Z87891 Personal history of nicotine dependence: Secondary | ICD-10-CM

## 2018-06-07 DIAGNOSIS — Z3A39 39 weeks gestation of pregnancy: Secondary | ICD-10-CM

## 2018-06-07 DIAGNOSIS — O3663X0 Maternal care for excessive fetal growth, third trimester, not applicable or unspecified: Secondary | ICD-10-CM | POA: Diagnosis not present

## 2018-06-07 DIAGNOSIS — O1002 Pre-existing essential hypertension complicating childbirth: Secondary | ICD-10-CM | POA: Diagnosis not present

## 2018-06-07 DIAGNOSIS — Z3A00 Weeks of gestation of pregnancy not specified: Secondary | ICD-10-CM | POA: Diagnosis not present

## 2018-06-07 DIAGNOSIS — O169 Unspecified maternal hypertension, unspecified trimester: Secondary | ICD-10-CM

## 2018-06-07 LAB — CBC
HCT: 38.4 % (ref 36.0–46.0)
Hemoglobin: 12.9 g/dL (ref 12.0–15.0)
MCH: 29 pg (ref 26.0–34.0)
MCHC: 33.6 g/dL (ref 30.0–36.0)
MCV: 86.3 fL (ref 80.0–100.0)
Platelets: 308 10*3/uL (ref 150–400)
RBC: 4.45 MIL/uL (ref 3.87–5.11)
RDW: 12.9 % (ref 11.5–15.5)
WBC: 12 10*3/uL — ABNORMAL HIGH (ref 4.0–10.5)
nRBC: 0 % (ref 0.0–0.2)

## 2018-06-07 LAB — COMPREHENSIVE METABOLIC PANEL
ALT: 15 U/L (ref 0–44)
AST: 21 U/L (ref 15–41)
Albumin: 2.8 g/dL — ABNORMAL LOW (ref 3.5–5.0)
Alkaline Phosphatase: 280 U/L — ABNORMAL HIGH (ref 38–126)
Anion gap: 11 (ref 5–15)
BUN: 8 mg/dL (ref 6–20)
CO2: 17 mmol/L — ABNORMAL LOW (ref 22–32)
Calcium: 9.7 mg/dL (ref 8.9–10.3)
Chloride: 105 mmol/L (ref 98–111)
Creatinine, Ser: 0.69 mg/dL (ref 0.44–1.00)
GFR calc Af Amer: >60 mL/min (ref >60)
GFR calc non Af Amer: >60 mL/min (ref >60)
Glucose, Bld: 126 mg/dL — ABNORMAL HIGH (ref 70–99)
Potassium: 3.9 mmol/L (ref 3.5–5.1)
Sodium: 133 mmol/L — ABNORMAL LOW (ref 135–145)
Total Bilirubin: 0.6 mg/dL (ref 0.3–1.2)
Total Protein: 5.7 g/dL — ABNORMAL LOW (ref 6.5–8.1)

## 2018-06-07 LAB — PROTEIN / CREATININE RATIO, URINE
Creatinine, Urine: 88.87 mg/dL
Protein Creatinine Ratio: 0.17 mg/mg{Cre} — ABNORMAL HIGH (ref 0.00–0.15)
Total Protein, Urine: 15 mg/dL

## 2018-06-07 LAB — TYPE AND SCREEN
ABO/RH(D): B POS
Antibody Screen: NEGATIVE

## 2018-06-07 LAB — RPR: RPR Ser Ql: NONREACTIVE

## 2018-06-07 LAB — ABO/RH: ABO/RH(D): B POS

## 2018-06-07 MED ORDER — LABETALOL HCL 200 MG PO TABS
200.0000 mg | ORAL_TABLET | Freq: Two times a day (BID) | ORAL | Status: DC
  Administered 2018-06-07 – 2018-06-10 (×6): 200 mg via ORAL
  Filled 2018-06-07 (×6): qty 1

## 2018-06-07 MED ORDER — LACTATED RINGERS IV SOLN: 500.0000 mL | INTRAVENOUS | Status: DC | PRN

## 2018-06-07 MED ORDER — TERBUTALINE SULFATE 1 MG/ML IJ SOLN: 0.2500 mg | Freq: Once | INTRAMUSCULAR | Status: DC | PRN

## 2018-06-07 MED ORDER — OXYTOCIN 40 UNITS IN NORMAL SALINE INFUSION - SIMPLE MED
1.0000 m[IU]/min | INTRAVENOUS | Status: DC
  Administered 2018-06-07: 2 m[IU]/min via INTRAVENOUS

## 2018-06-07 MED ORDER — LIDOCAINE HCL (PF) 1 % IJ SOLN
30.0000 mL | INTRAMUSCULAR | Status: DC | PRN
  Filled 2018-06-07: qty 30

## 2018-06-07 MED ORDER — LACTATED RINGERS IV SOLN
INTRAVENOUS | Status: DC
  Administered 2018-06-07 – 2018-06-10 (×8): via INTRAVENOUS

## 2018-06-07 MED ORDER — OXYCODONE-ACETAMINOPHEN 5-325 MG PO TABS: 2.0000 | ORAL_TABLET | ORAL | Status: DC | PRN

## 2018-06-07 MED ORDER — OXYCODONE-ACETAMINOPHEN 5-325 MG PO TABS: 1.0000 | ORAL_TABLET | ORAL | Status: DC | PRN

## 2018-06-07 MED ORDER — ACETAMINOPHEN 325 MG PO TABS
650.0000 mg | ORAL_TABLET | ORAL | Status: DC | PRN
  Administered 2018-06-07 – 2018-06-10 (×6): 650 mg via ORAL
  Filled 2018-06-07 (×6): qty 2

## 2018-06-07 MED ORDER — OXYTOCIN 40 UNITS IN NORMAL SALINE INFUSION - SIMPLE MED
2.5000 [IU]/h | INTRAVENOUS | Status: DC
  Filled 2018-06-07: qty 1000

## 2018-06-07 MED ORDER — SOD CITRATE-CITRIC ACID 500-334 MG/5ML PO SOLN
30.0000 mL | ORAL | Status: DC | PRN
  Administered 2018-06-10: 30 mL via ORAL
  Filled 2018-06-07: qty 15

## 2018-06-07 MED ORDER — ONDANSETRON HCL 4 MG/2ML IJ SOLN
4.0000 mg | Freq: Four times a day (QID) | INTRAMUSCULAR | Status: DC | PRN
  Administered 2018-06-07: 4 mg via INTRAVENOUS
  Filled 2018-06-07: qty 2

## 2018-06-07 MED ORDER — OXYTOCIN BOLUS FROM INFUSION: 500.0000 mL | Freq: Once | INTRAVENOUS | Status: DC

## 2018-06-07 MED ORDER — MISOPROSTOL 50MCG HALF TABLET
50.0000 ug | ORAL_TABLET | ORAL | Status: DC | PRN
  Administered 2018-06-07 – 2018-06-08 (×3): 50 ug via BUCCAL
  Filled 2018-06-07 (×3): qty 1

## 2018-06-07 NOTE — H&P (Signed)
Tami Swanson is a 28 y.o. female presenting for IOL 2/2 CHTN in pregnancy. OB History    Gravida  1   Para      Term      Preterm      AB      Living  0     SAB      TAB      Ectopic      Multiple      Live Births             Past Medical History:  Diagnosis Date  . Hypertension    Past Surgical History:  Procedure Laterality Date  . FOOT SURGERY Right 2006   Family History: family history includes Heart disease in her maternal grandfather; Hypertension in her father. Social History:  reports that she has quit smoking. Her smoking use included cigarettes. She smoked 0.25 packs per day. She has quit using smokeless tobacco. She reports that she does not drink alcohol or use drugs.   Nursing Staff Provider  Office Location [facility] Dating   edc 4/19, lmp=8wk u/s  Language  English  Anatomy US   normal  Flu Vaccine  11/17/2017 Genetic Screen  Quad: neg   TDaP vaccine   03/10/2018 Hgb A1C or  GTT Early: neg a1c Third trimester: neg  Rhogam  N/A   LAB RESULTS   Feeding Plan Breast  Blood Type B/Positive/-- (10/10 1505) B pos  Contraception Pills and condoms Antibody Negative (10/10 1505)neg  Circumcision Yes Rubella 6.69 (10/10 1505)imm  Pediatrician  Undecided RPR Non Reactive (10/10 1505) neg  Support Person Mother HBsAg Negative (10/10 1505) neg  Prenatal Classes Undecided HIV Non Reactive (10/10 1505)    GBS  Negative    Pap 11/07/2017    Hgb Electro  Declined    CF Declined    SMA Declined      Maternal Diabetes: No Genetic Screening: Normal Maternal Ultrasounds/Referrals: Normal Fetal Ultrasounds or other Referrals:  None Maternal Substance Abuse:  No Significant Maternal Medications:  None Significant Maternal Lab Results:  None Other Comments:  None  Review of Systems  All other systems reviewed and are negative.  Maternal Medical History:  Fetal activity: Perceived fetal activity is normal.   Last perceived fetal movement was within the  past hour.    Prenatal complications: PIH.   Prenatal Complications - Diabetes: none.      Blood pressure (!) 149/92, pulse (!) 114, temperature 98.4 F (36.9 C), temperature source Oral, resp. rate 20, height 5\' 7"  (1.702 m), weight 117.2 kg, last menstrual period 09/07/2017. Maternal Exam:  Abdomen: Patient reports no abdominal tenderness. Fetal presentation: vertex  Introitus: Normal vulva. Normal vagina.  Pelvis: adequate for delivery.   Cervix: Cervix evaluated by sterile speculum exam.     Fetal Exam Fetal Monitor Review: Mode: ultrasound.   Baseline rate: 125.  Variability: moderate (6-25 bpm).   Pattern: accelerations present and no decelerations.    Fetal State Assessment: Category I - tracings are normal.     Physical Exam  Nursing note and vitals reviewed. Constitutional: She is oriented to person, place, and time. She appears well-developed and well-nourished. No distress.  HENT:  Head: Normocephalic.  Cardiovascular: Normal rate.  Respiratory: Effort normal.  GI: Soft. There is no abdominal tenderness.  Genitourinary:    Vulva normal.     Genitourinary Comments:  External: no lesion Vagina: small amount of white discharge Cervix: pink, smooth, no CMT, visually closed and thick. FB with  stylet inserted without difficulty. Uterine balloon filled with 50cc LR.  Uterus: AGA    Neurological: She is alert and oriented to person, place, and time.  Skin: Skin is warm and dry.  Psychiatric: She has a normal mood and affect.    Pt informed that the ultrasound is considered a limited OB ultrasound and is not intended to be a complete ultrasound exam.  Patient also informed that the ultrasound is not being completed with the intent of assessing for fetal or placental anomalies or any pelvic abnormalities.  Explained that the purpose of today's ultrasound is to assess for  presentation.  Patient acknowledges the purpose of the exam and the limitations of the study.     VERTEX    Prenatal labs: ABO, Rh: --/--/B POS, B POS Performed at [facility], [address], [place], [place] [address]  [phone]) Antibody: NEG (04/12 0736) Rubella: 6.69 (10/10 1505) RPR: Non Reactive (01/14 0812)  HBsAg: Negative (10/10 1505)  HIV: Non Reactive (01/14 4888)  GBS:   Negative   Assessment/Plan: 28 y.o. G1P0 at [redacted]w[redacted]d  Admit to labor and delivery  IOL 2/2 CHTN- not currently on any medications  Baseline pre-eclampsia labs ordered on admission 140/90 BP on admission, will add anti-hypertensives as needed  FB, cytotec for cervical ripening  Patient Active Problem List   Diagnosis Date Noted  . HTN complicating peripregnancy, antepartum 06/07/2018  . Chronic hypertension complicating pregnancy, antepartum 12/04/2017  . Obesity (BMI 30-39.9) 12/04/2017  . Obesity in pregnancy 12/04/2017  . Condyloma acuminata of vulva in pregnancy, unspecified trimester 12/03/2017  . History of genital warts 12/03/2017  . Hirsutism 12/03/2017  . Supervision of high risk pregnancy, antepartum 12/03/2017  . Tobacco dependence syndrome 06/19/2017      Thressa Sheller 06/07/2018, 9:32 AM

## 2018-06-07 NOTE — Progress Notes (Signed)
Tami Swanson is a 28 y.o. G1P0 at [redacted]w[redacted]d admitted for induction of labor due to Hypertension.  Subjective:  No complaints. Feeling some tightening with contractions, but not a lot of pain at the moment.   Objective: BP 129/69   Pulse 88   Temp 98.8 F (37.1 C) (Oral)   Resp 18   Ht 5\' 7"  (1.702 m)   Wt 117.2 kg   LMP 09/07/2017 (Approximate)   BMI 40.47 kg/m  No intake/output data recorded. No intake/output data recorded.  FHT:  FHR: 150 bpm, variability: moderate,  accelerations:  Present,  decelerations:  Absent UC:   regular, every 2-3 minutes SVE:   Dilation: 4 Effacement (%): 50 Station: -2 Exam by:: Thressa Sheller CNM  Labs: Lab Results  Component Value Date   WBC 12.0 (H) 06/07/2018   HGB 12.9 06/07/2018   HCT 38.4 06/07/2018   MCV 86.3 06/07/2018   PLT 308 06/07/2018    Assessment / Plan: IOL, Foley Bulb out   Labor: Progressing normally and will start pitocin 2x2 now Preeclampsia:  labs stable, labetalol 200mg  BID ordered  Fetal Wellbeing:  Category I Pain Control:  Labor support without medications I/D:  n/a Anticipated MOD:  NSVD  Thressa Sheller DNP, CNM  06/07/18  6:36 PM

## 2018-06-07 NOTE — Progress Notes (Signed)
Tami Swanson is a 28 y.o. G1P0 at [redacted]w[redacted]d admitted for induction of labor due to [facility].  Subjective:  No complaints. Feeling some cramping and back pain. Unsure if it is contractions at this time.   Objective: BP (!) 147/96 (BP Location: Left Arm)   Pulse (!) 111   Temp 98.3 F (36.8 C) (Oral)   Resp 18   Ht 5\' 7"  (1.702 m)   Wt 117.2 kg   LMP 09/07/2017 (Approximate)   BMI 40.47 kg/m  No intake/output data recorded. No intake/output data recorded.  FHT:  FHR: 155 bpm, variability: moderate,  accelerations:  Present,  decelerations:  Absent UC:   none SVE:   Dilation: Closed Effacement (%): Thick Station: -3 Exam by:: Mathews Robinsons, CNM  Labs: Lab Results  Component Value Date   WBC 12.0 (H) 06/07/2018   HGB 12.9 06/07/2018   HCT 38.4 06/07/2018   MCV 86.3 06/07/2018   PLT 308 06/07/2018    Assessment / Plan: IOL still in cervical ripening phase. FB in place. Cytotec #2 given   Labor: latent phase  Preeclampsia:  PEC labs normal. B/P trending 140/90s. No symptoms  Fetal Wellbeing:  Category I Pain Control:  Labor support without medications I/D:  n/a Anticipated MOD:  NSVD  Thressa Sheller DNP, CNM  06/07/18  2:55 PM

## 2018-06-07 NOTE — Progress Notes (Signed)
Labor Progress Note Tami Swanson is a 28 y.o. G1P0 at [redacted]w[redacted]d presented for IOL for CHTN  S:  Patient comfortable. Denies HA, visual changes or epigastric pain  O:  BP 132/88   Pulse 94   Temp 98.6 F (37 C) (Oral)   Resp 18   Ht 5\' 7"  (1.702 m)   Wt 117.2 kg   LMP 09/07/2017 (Approximate)   BMI 40.47 kg/m   Fetal Tracing:  Baseline: 135 Variability: moderate Accels: 15x15 Decels: none  Toco: 1-5   CVE: Dilation: 4 Effacement (%): 70 Station: -2 Presentation: Vertex Exam by:: Antony Odea, CNM   A&P: 28 y.o. G1P0 [redacted]w[redacted]d IOL for CHTN #Labor: Progressing well. Continue pitocin #Pain: per patient request #FWB: Cat 1 #GBS negative  Rolm Bookbinder, CNM 11:03 PM

## 2018-06-08 ENCOUNTER — Encounter (HOSPITAL_COMMUNITY): Payer: Self-pay

## 2018-06-08 ENCOUNTER — Ambulatory Visit (HOSPITAL_COMMUNITY): Payer: BLUE CROSS/BLUE SHIELD

## 2018-06-08 MED ORDER — OXYTOCIN 40 UNITS IN NORMAL SALINE INFUSION - SIMPLE MED
1.0000 m[IU]/min | INTRAVENOUS | Status: DC
  Administered 2018-06-08 – 2018-06-09 (×2): 2 m[IU]/min via INTRAVENOUS
  Filled 2018-06-08: qty 1000

## 2018-06-08 MED ORDER — FENTANYL CITRATE (PF) 100 MCG/2ML IJ SOLN
100.0000 ug | INTRAMUSCULAR | Status: DC | PRN
  Administered 2018-06-09 (×2): 100 ug via INTRAVENOUS
  Filled 2018-06-08 (×2): qty 2

## 2018-06-08 NOTE — Progress Notes (Signed)
SL IV, pt ate and in shower now.

## 2018-06-08 NOTE — Progress Notes (Signed)
LABOR PROGRESS NOTE  Uva Borseth is a 28 y.o. G1P0 at [redacted]w[redacted]d  admitted for IOL for cHTN.   Subjective: Strip note. Discussed plan of care with RN.   Objective: BP (!) 154/81   Pulse 96   Temp 98.4 F (36.9 C) (Oral)   Resp 18   Ht 5\' 7"  (1.702 m)   Wt 117.2 kg   LMP 09/07/2017 (Approximate)   BMI 40.47 kg/m  or  Vitals:   06/08/18 1801 06/08/18 1807 06/08/18 1831 06/08/18 1901  BP: (!) 152/77  (!) 142/86 (!) 154/81  Pulse: 87  93 96  Resp:  18 18 18   Temp:      TempSrc:      Weight:      Height:        Dilation: 5 Effacement (%): 60 Station: -3 Presentation: Vertex Exam by:: Welford Roche, RNC FHT: baseline rate 125, moderate varibility, +acel, nodecel Toco: q2-4 min   Labs: Lab Results  Component Value Date   WBC 12.0 (H) 06/07/2018   HGB 12.9 06/07/2018   HCT 38.4 06/07/2018   MCV 86.3 06/07/2018   PLT 308 06/07/2018    Patient Active Problem List   Diagnosis Date Noted  . HTN complicating peripregnancy, antepartum 06/07/2018  . Chronic hypertension complicating pregnancy, antepartum 12/04/2017  . Obesity (BMI 30-39.9) 12/04/2017  . Obesity in pregnancy 12/04/2017  . Condyloma acuminata of vulva in pregnancy, unspecified trimester 12/03/2017  . History of genital warts 12/03/2017  . Hirsutism 12/03/2017  . Supervision of high risk pregnancy, antepartum 12/03/2017  . Tobacco dependence syndrome 06/19/2017    Assessment / Plan: 28 y.o. G1P0 at [redacted]w[redacted]d here for IOL for cHTN.   Labor: Induction. Patient was on Pitocin at 40 mu/min. Was initially feeling uncomfortable but no longer feeling pain. Requests Pit break so that she can eat and shower. Will plan to resume Pitocin at 2330. At that time would consider AROM and IUPC placement.  Fetal Wellbeing:  Cat I  Pain Control:  Planning for epidural  Anticipated MOD:  NSVD   Marcy Siren, D.O. OB Fellow  06/08/2018, 7:42 PM

## 2018-06-08 NOTE — Progress Notes (Signed)
Labor Progress Note Tami Swanson is a 28 y.o. G1P0 at [redacted]w[redacted]d presented for IOL for CHTN  S:  Patient comfortable. Not feeling contractions. Denies HA, visual changes or epigastric pain  O:  BP 130/73   Pulse 85   Temp 98.5 F (36.9 C) (Oral)   Resp 18   Ht 5\' 7"  (1.702 m)   Wt 117.2 kg   LMP 09/07/2017 (Approximate)   BMI 40.47 kg/m   Fetal Tracing:  Baseline: 135 Variability: moderate Accels: 15x15 Decels: none  Toco: 2-5   CVE: Dilation: 4 Effacement (%): 70 Station: -2 Presentation: Vertex Exam by:: Cleone Slim, CNM   A&P: 28 y.o. G1P0 [redacted]w[redacted]d IOL for CHTN #Labor: No cervical change. Patient intolerant of exams, unable to AROM. Pitocin on 46mu/min. Will do pitocin break and give dose of cytotec instead. Plan to recheck in 4 hours and restart pitocin. #Pain: per patient request #FWB: Cat 1 #GBS negative  Rolm Bookbinder, CNM 3:15 AM

## 2018-06-08 NOTE — Progress Notes (Signed)
LABOR PROGRESS NOTE  Tami Swanson is a 28 y.o. G1P0 at [redacted]w[redacted]d  admitted for IOL for cHTN.   Subjective: Patient becoming discouraged with lack of cervical change. FOB at bedside. Not feeling uncomfortable with contractions but very intolerant of cervical exam.   Objective: BP 118/79   Pulse 91   Temp 98.4 F (36.9 C) (Oral)   Resp 16   Ht 5\' 7"  (1.702 m)   Wt 117.2 kg   LMP 09/07/2017 (Approximate)   BMI 40.47 kg/m  or  Vitals:   06/08/18 1329 06/08/18 1331 06/08/18 1334 06/08/18 1431  BP:  119/75  118/79  Pulse:  83  91  Resp: 16  16 16   Temp:      TempSrc:      Weight:      Height:        Dilation: 5 Effacement (%): 60 Station: -3 Presentation: Vertex Exam by:: Welford Roche, RNC FHT: baseline rate 140, moderate varibility, +acel, nodecel Toco: q3-5 min   Labs: Lab Results  Component Value Date   WBC 12.0 (H) 06/07/2018   HGB 12.9 06/07/2018   HCT 38.4 06/07/2018   MCV 86.3 06/07/2018   PLT 308 06/07/2018    Patient Active Problem List   Diagnosis Date Noted  . HTN complicating peripregnancy, antepartum 06/07/2018  . Chronic hypertension complicating pregnancy, antepartum 12/04/2017  . Obesity (BMI 30-39.9) 12/04/2017  . Obesity in pregnancy 12/04/2017  . Condyloma acuminata of vulva in pregnancy, unspecified trimester 12/03/2017  . History of genital warts 12/03/2017  . Hirsutism 12/03/2017  . Supervision of high risk pregnancy, antepartum 12/03/2017  . Tobacco dependence syndrome 06/19/2017    Assessment / Plan: 28 y.o. G1P0 at [redacted]w[redacted]d here for IOL for cHTN.   Labor: Induction. Patient had Pitocin break with cytotec due to thick cervix overnight. Pitocin resumed at 0800, now at 28 mu/min. Patient not uncomfortable with contractions. Attempted to AROM to place IUPC but patient very intolerant of cervical exam. Will re-assess at 1800. If still not changed, will plan for Pitocin break.  Fetal Wellbeing:  Cat I  Pain Control:  Planning for epidural   Anticipated MOD:  NSVD   Marcy Siren, D.O. OB Fellow  06/08/2018, 3:43 PM

## 2018-06-08 NOTE — Progress Notes (Signed)
Per Dr. Marcy Siren, pt can have a pit break, eat and shower. Pt notified.

## 2018-06-09 ENCOUNTER — Inpatient Hospital Stay (HOSPITAL_COMMUNITY): Payer: BLUE CROSS/BLUE SHIELD | Admitting: Anesthesiology

## 2018-06-09 ENCOUNTER — Encounter: Payer: BLUE CROSS/BLUE SHIELD | Admitting: Obstetrics & Gynecology

## 2018-06-09 ENCOUNTER — Encounter (HOSPITAL_COMMUNITY): Payer: Self-pay | Admitting: *Deleted

## 2018-06-09 LAB — CBC
HCT: 34.8 % — ABNORMAL LOW (ref 36.0–46.0)
Hemoglobin: 11.7 g/dL — ABNORMAL LOW (ref 12.0–15.0)
MCH: 29.3 pg (ref 26.0–34.0)
MCHC: 33.6 g/dL (ref 30.0–36.0)
MCV: 87 fL (ref 80.0–100.0)
Platelets: 245 10*3/uL (ref 150–400)
RBC: 4 MIL/uL (ref 3.87–5.11)
RDW: 13.2 % (ref 11.5–15.5)
WBC: 10.1 10*3/uL (ref 4.0–10.5)
nRBC: 0 % (ref 0.0–0.2)

## 2018-06-09 MED ORDER — DIPHENHYDRAMINE HCL 50 MG/ML IJ SOLN
12.5000 mg | INTRAMUSCULAR | Status: AC | PRN
  Administered 2018-06-09 (×3): 12.5 mg via INTRAVENOUS
  Filled 2018-06-09 (×2): qty 1

## 2018-06-09 MED ORDER — EPHEDRINE 5 MG/ML INJ: 10.0000 mg | INTRAVENOUS | Status: DC | PRN

## 2018-06-09 MED ORDER — LACTATED RINGERS IV SOLN
500.0000 mL | Freq: Once | INTRAVENOUS | Status: AC
  Administered 2018-06-10: 500 mL via INTRAVENOUS

## 2018-06-09 MED ORDER — PHENYLEPHRINE 40 MCG/ML (10ML) SYRINGE FOR IV PUSH (FOR BLOOD PRESSURE SUPPORT)
80.0000 ug | PREFILLED_SYRINGE | INTRAVENOUS | Status: DC | PRN
  Filled 2018-06-09: qty 10

## 2018-06-09 MED ORDER — FENTANYL-BUPIVACAINE-NACL 0.5-0.125-0.9 MG/250ML-% EP SOLN
12.0000 mL/h | EPIDURAL | Status: DC | PRN
  Administered 2018-06-10: 12 mL/h via EPIDURAL
  Filled 2018-06-09 (×2): qty 250

## 2018-06-09 MED ORDER — SODIUM CHLORIDE (PF) 0.9 % IJ SOLN
INTRAMUSCULAR | Status: DC | PRN
  Administered 2018-06-09: 14 mL/h via EPIDURAL

## 2018-06-09 MED ORDER — PHENYLEPHRINE 40 MCG/ML (10ML) SYRINGE FOR IV PUSH (FOR BLOOD PRESSURE SUPPORT): 80.0000 ug | PREFILLED_SYRINGE | INTRAVENOUS | Status: DC | PRN

## 2018-06-09 MED ORDER — LIDOCAINE-EPINEPHRINE (PF) 2 %-1:200000 IJ SOLN
INTRAMUSCULAR | Status: DC | PRN
  Administered 2018-06-09 – 2018-06-10 (×3): 5 mL via EPIDURAL
  Administered 2018-06-10: 3 mL via EPIDURAL

## 2018-06-09 MED ORDER — MISOPROSTOL 25 MCG QUARTER TABLET
25.0000 ug | ORAL_TABLET | ORAL | Status: DC | PRN
  Administered 2018-06-09 (×2): 25 ug via VAGINAL
  Filled 2018-06-09 (×2): qty 1

## 2018-06-09 NOTE — Anesthesia Preprocedure Evaluation (Addendum)
Anesthesia Evaluation  Patient identified by MRN, date of birth, ID band Patient awake    Reviewed: Allergy & Precautions, H&P , NPO status , Patient's Chart, lab work & pertinent test results  History of Anesthesia Complications Negative for: history of anesthetic complications  Airway Mallampati: II  TM Distance: >3 FB Neck ROM: full    Dental no notable dental hx.    Pulmonary neg pulmonary ROS, former smoker,    Pulmonary exam normal        Cardiovascular hypertension, Normal cardiovascular exam Rhythm:regular Rate:Normal     Neuro/Psych negative neurological ROS  negative psych ROS   GI/Hepatic negative GI ROS, Neg liver ROS,   Endo/Other  Morbid obesity  Renal/GU negative Renal ROS  negative genitourinary   Musculoskeletal   Abdominal   Peds  Hematology negative hematology ROS (+)   Anesthesia Other Findings   Reproductive/Obstetrics (+) Pregnancy                            Anesthesia Physical Anesthesia Plan  ASA: III  Anesthesia Plan: Epidural   Post-op Pain Management:    Induction:   PONV Risk Score and Plan:   Airway Management Planned:   Additional Equipment:   Intra-op Plan:   Post-operative Plan:   Informed Consent: I have reviewed the patients History and Physical, chart, labs and discussed the procedure including the risks, benefits and alternatives for the proposed anesthesia with the patient or authorized representative who has indicated his/her understanding and acceptance.       Plan Discussed with:   Anesthesia Plan Comments:         Anesthesia Quick Evaluation

## 2018-06-09 NOTE — Progress Notes (Signed)
[facility]N Faculty [facility]: Labor Progress Note  Subjective: Doing well, comfortable. Was able to eat and shower during pitocin break. Has not been feeling any contractions just vaginal pain.   Objective: BP 139/84   Pulse 96   Temp 98.2 F (36.8 C) (Oral)   Resp 20   Ht 5\' 7"  (1.702 m)   Wt 117.2 kg   LMP 09/07/2017 (Approximate)   BMI 40.47 kg/m  Gen: comfortable appearing Dilation: 4 Effacement (%): Thick Station: -3 Presentation: Vertex Exam by:: Dr. Earlene Plater  Assessment and Plan: 28 y.o. G1P0 [redacted]w[redacted]d here for IOL for chronic HTN.  Labor: Induction started yesterday morning with FB and cytotec. Received three doses of cytotec, FB out then transitioned to pitocin. Was on pitocin for about 8 hours then 5-hour break, 11 hours then 4-hour break. Pitocin was restarted this evening but on my check cervix is really more like 4/50/ballotable. Head not engaged in pelvis - will switch back to vaginal cytotec.  -- pain control: desires epidural -- PPH Risk: medium  Fetal Well-Being: EFW LGA 3913g >90% at 38w1. Cephalic by sutures.  -- Category I - continuous fetal monitoring  -- GBS negative  CHTN: BP moderate range.  UPC 0.17, labs wnl. Asymptomatic at this time. -- continue to monitor closely   Myrel Rappleye S. Earlene Plater, DO OB/GYN Fellow, Faculty Practice  12:40 AM

## 2018-06-09 NOTE — Anesthesia Procedure Notes (Signed)
Epidural Patient location during procedure: OB Start time: 06/09/2018 1:41 PM End time: 06/09/2018 1:51 PM  Staffing Anesthesiologist: Lucretia Kern, MD Performed: anesthesiologist   Preanesthetic Checklist Completed: patient identified, pre-op evaluation, timeout performed, IV checked, risks and benefits discussed and monitors and equipment checked  Epidural Patient position: sitting Prep: DuraPrep Patient monitoring: heart rate, continuous pulse ox and blood pressure Approach: midline Location: L3-L4 Injection technique: LOR air  Needle:  Needle type: Tuohy  Needle gauge: 17 G Needle length: 9 cm Needle insertion depth: 6.5 cm Catheter type: closed end flexible Catheter size: 19 Gauge Catheter at skin depth: 11.5 cm Test dose: negative and 2% lidocaine with Epi 1:200 K  Assessment Events: blood not aspirated, injection not painful, no injection resistance, negative IV test and no paresthesia  Additional Notes Reason for block:procedure for pain

## 2018-06-09 NOTE — Progress Notes (Signed)
[facility]N Faculty [facility]: Labor Progress Note  Subjective: Strip note. Plan of care discussed with RN.  Objective: BP 130/90   Pulse 91   Temp 98.6 F (37 C) (Oral)   Resp 18   Ht 5\' 7"  (1.702 m)   Wt 117.2 kg   LMP 09/07/2017 (Approximate)   SpO2 98%   BMI 40.47 kg/m  Gen: strip note Dilation: 5 Effacement (%): 80 Cervical Position: Posterior Station: -2 Presentation: Vertex Exam by:: Valentina Lucks, RN  Assessment and Plan: 28 y.o. G1P0 [redacted]w[redacted]d here for IOL for chronic HTN.  Labor: Progressed well today. AROM around 1500, now has IUPC in place and contractions mostly adequate. Plan to recheck in next hour or so.  -- pain control: desires epidural -- PPH Risk: medium  Fetal Well-Being: EFW LGA 3913g >90% at 38w1. Cephalic by sutures.  -- Category I - continuous fetal monitoring  -- GBS negative  CHTN: BP moderate range.  UPC 0.17, labs wnl. Asymptomatic at this time. -- continue to monitor closely   Manus Weedman S. Earlene Plater, DO OB/GYN Fellow, Faculty Practice  9:03 PM

## 2018-06-09 NOTE — Progress Notes (Addendum)
LABOR PROGRESS NOTE  Tami Swanson is a 28 y.o. G1P0 at [redacted]w[redacted]d  admitted for IOL for cHTN.   Subjective: At bedside with patient, recommended epidural placement so that can better tolerate cervical exam to see if AROM/IUPC placement possible and to perhaps relax body for better progression to active labor. Patient in agreement.   Objective: BP 138/81   Pulse 90   Temp 98.2 F (36.8 C) (Axillary)   Resp 20   Ht 5\' 7"  (1.702 m)   Wt 117.2 kg   LMP 09/07/2017 (Approximate)   BMI 40.47 kg/m  or  Vitals:   06/09/18 1010 06/09/18 1052 06/09/18 1132 06/09/18 1205  BP:  136/85 129/82 138/81  Pulse:  85 93 90  Resp: 18 20 18 20   Temp:      TempSrc:      Weight:      Height:        Dilation: 4 Effacement (%): 50 Cervical Position: Posterior Station: -3 Presentation: Vertex Exam by:: Valentina Lucks, RN FHT: baseline rate 135, moderate varibility, +acel, no decel Toco: q2-4 min   Labs: Lab Results  Component Value Date   WBC 12.0 (H) 06/07/2018   HGB 12.9 06/07/2018   HCT 38.4 06/07/2018   MCV 86.3 06/07/2018   PLT 308 06/07/2018    Patient Active Problem List   Diagnosis Date Noted  . HTN complicating peripregnancy, antepartum 06/07/2018  . Chronic hypertension complicating pregnancy, antepartum 12/04/2017  . Obesity (BMI 30-39.9) 12/04/2017  . Obesity in pregnancy 12/04/2017  . Condyloma acuminata of vulva in pregnancy, unspecified trimester 12/03/2017  . History of genital warts 12/03/2017  . Hirsutism 12/03/2017  . Supervision of high risk pregnancy, antepartum 12/03/2017  . Tobacco dependence syndrome 06/19/2017    Assessment / Plan: 28 y.o. G1P0 at [redacted]w[redacted]d here for IOL for cHTN. Blood pressures are normotensive to mildly moderately elevated. Asymptomatic.   Labor: Long induction. Patient not yet in active labor. Has had two rounds of Pitocin. Discontinued pitocin overnight, given 2 doses of Cytotec. Resumed Pitocin at 1000 now on 8 mu/min. Patient will now get epidural.  Hopeful on next exam fetal head will be more applied and will be able to AROM and place IUPC.  Fetal Wellbeing:  Cat I  Pain Control:  Plans for epidural  Anticipated MOD:  NSVD   Marcy Siren, D.O. OB Fellow  06/09/2018, 12:36 PM

## 2018-06-09 NOTE — Progress Notes (Signed)
OB/[facility]N Faculty [facility]: Labor Progress Note  Subjective: Was able to get some sleep. Still not really feeling any contractions.    Objective: BP 136/76   Pulse 95   Temp 98.4 F (36.9 C) (Oral)   Resp 18   Ht 5\' 7"  (1.702 m)   Wt 117.2 kg   LMP 09/07/2017 (Approximate)   BMI 40.47 kg/m  Gen: tired but comfortable appearing  Dilation: 4 Effacement (%): 50 Station: Ballotable Presentation: Vertex Exam by:: Dr. Earlene Plater  Assessment and Plan: 28 y.o. G1P0 [redacted]w[redacted]d here for IOL for chronic HTN.  Labor: Switched back to cytotec overnight. Will plan to start pitocin for third round after eating breakfast. Discussed with patient that given minimal change and still ballotable, may discuss with providers today about C/S. She is amenable to this plan.  -- pain control: desires epidural -- PPH Risk: medium  Fetal Well-Being: EFW LGA 3913g >90% at 38w1. Cephalic by sutures.  -- Category I - continuous fetal monitoring  -- GBS negative  CHTN: BP moderate range.  UPC 0.17, labs wnl. Asymptomatic at this time. -- continue to monitor closely   Kameran Lallier S. Earlene Plater, DO OB/GYN Fellow, Faculty Practice  7:45 AM

## 2018-06-10 ENCOUNTER — Encounter (HOSPITAL_COMMUNITY): Payer: Self-pay | Admitting: Anesthesiology

## 2018-06-10 ENCOUNTER — Encounter (HOSPITAL_COMMUNITY)
Admission: AD | Disposition: A | Discharge status: Home / Self Care | Payer: Self-pay | Source: Home / Self Care | Attending: Obstetrics and Gynecology

## 2018-06-10 DIAGNOSIS — Z3A39 39 weeks gestation of pregnancy: Secondary | ICD-10-CM

## 2018-06-10 DIAGNOSIS — O1002 Pre-existing essential hypertension complicating childbirth: Secondary | ICD-10-CM

## 2018-06-10 LAB — CREATININE, SERUM
Creatinine, Ser: 1.43 mg/dL — ABNORMAL HIGH (ref 0.44–1.00)
GFR calc Af Amer: 57 mL/min — ABNORMAL LOW (ref >60)
GFR calc non Af Amer: 49 mL/min — ABNORMAL LOW (ref >60)

## 2018-06-10 LAB — CBC
HCT: 35.5 % — ABNORMAL LOW (ref 36.0–46.0)
Hemoglobin: 11.8 g/dL — ABNORMAL LOW (ref 12.0–15.0)
MCH: 28.9 pg (ref 26.0–34.0)
MCHC: 33.2 g/dL (ref 30.0–36.0)
MCV: 86.8 fL (ref 80.0–100.0)
Platelets: 252 10*3/uL (ref 150–400)
RBC: 4.09 MIL/uL (ref 3.87–5.11)
RDW: 13.2 % (ref 11.5–15.5)
WBC: 23.5 10*3/uL — ABNORMAL HIGH (ref 4.0–10.5)
nRBC: 0 % (ref 0.0–0.2)

## 2018-06-10 SURGERY — Surgical Case
Anesthesia: Epidural | Site: Abdomen | Wound class: Clean Contaminated

## 2018-06-10 MED ORDER — PRENATAL MULTIVITAMIN CH
1.0000 | ORAL_TABLET | Freq: Every day | ORAL | Status: DC
  Administered 2018-06-11 – 2018-06-13 (×3): 1 via ORAL
  Filled 2018-06-10 (×3): qty 1

## 2018-06-10 MED ORDER — NALOXONE HCL 0.4 MG/ML IJ SOLN: 0.4000 mg | INTRAMUSCULAR | Status: DC | PRN

## 2018-06-10 MED ORDER — COCONUT OIL OIL
1.0000 "application " | TOPICAL_OIL | Status: DC | PRN
  Administered 2018-06-12: 1 via TOPICAL

## 2018-06-10 MED ORDER — DIBUCAINE (PERIANAL) 1 % EX OINT: 1.0000 "application " | TOPICAL_OINTMENT | CUTANEOUS | Status: DC | PRN

## 2018-06-10 MED ORDER — LIDOCAINE-EPINEPHRINE (PF) 2 %-1:200000 IJ SOLN
INTRAMUSCULAR | Status: AC
  Filled 2018-06-10: qty 10

## 2018-06-10 MED ORDER — KETOROLAC TROMETHAMINE 30 MG/ML IJ SOLN: 30.0000 mg | Freq: Four times a day (QID) | INTRAMUSCULAR | Status: AC | PRN

## 2018-06-10 MED ORDER — DIPHENHYDRAMINE HCL 50 MG/ML IJ SOLN
12.5000 mg | Freq: Once | INTRAMUSCULAR | Status: AC
  Administered 2018-06-10: 12.5 mg via INTRAVENOUS
  Filled 2018-06-10: qty 1

## 2018-06-10 MED ORDER — IBUPROFEN 600 MG PO TABS
600.0000 mg | ORAL_TABLET | Freq: Four times a day (QID) | ORAL | Status: DC | PRN
  Administered 2018-06-11 – 2018-06-13 (×9): 600 mg via ORAL
  Filled 2018-06-10 (×9): qty 1

## 2018-06-10 MED ORDER — MEPERIDINE HCL 25 MG/ML IJ SOLN: 6.2500 mg | INTRAMUSCULAR | Status: DC | PRN

## 2018-06-10 MED ORDER — DEXAMETHASONE SODIUM PHOSPHATE 4 MG/ML IJ SOLN
INTRAMUSCULAR | Status: AC
  Filled 2018-06-10: qty 1

## 2018-06-10 MED ORDER — ENOXAPARIN SODIUM 60 MG/0.6ML ~~LOC~~ SOLN
0.5000 mg/kg | SUBCUTANEOUS | Status: DC
  Administered 2018-06-11 – 2018-06-13 (×3): 60 mg via SUBCUTANEOUS
  Filled 2018-06-10 (×3): qty 0.6

## 2018-06-10 MED ORDER — SCOPOLAMINE 1 MG/3DAYS TD PT72
MEDICATED_PATCH | TRANSDERMAL | Status: DC | PRN
  Administered 2018-06-10: 1 via TRANSDERMAL

## 2018-06-10 MED ORDER — PROMETHAZINE HCL 25 MG/ML IJ SOLN: 6.2500 mg | INTRAMUSCULAR | Status: DC | PRN

## 2018-06-10 MED ORDER — ACETAMINOPHEN 500 MG PO TABS
1000.0000 mg | ORAL_TABLET | Freq: Four times a day (QID) | ORAL | Status: DC
  Administered 2018-06-10 – 2018-06-13 (×11): 1000 mg via ORAL
  Filled 2018-06-10 (×13): qty 2

## 2018-06-10 MED ORDER — SIMETHICONE 80 MG PO CHEW
80.0000 mg | CHEWABLE_TABLET | ORAL | Status: DC
  Administered 2018-06-10 – 2018-06-13 (×3): 80 mg via ORAL
  Filled 2018-06-10 (×3): qty 1

## 2018-06-10 MED ORDER — MENTHOL 3 MG MT LOZG: 1.0000 | LOZENGE | OROMUCOSAL | Status: DC | PRN

## 2018-06-10 MED ORDER — ACETAMINOPHEN 500 MG PO TABS: 1000.0000 mg | ORAL_TABLET | Freq: Four times a day (QID) | ORAL | Status: DC

## 2018-06-10 MED ORDER — MEPERIDINE HCL 25 MG/ML IJ SOLN
INTRAMUSCULAR | Status: AC
  Filled 2018-06-10: qty 1

## 2018-06-10 MED ORDER — SENNOSIDES-DOCUSATE SODIUM 8.6-50 MG PO TABS
2.0000 | ORAL_TABLET | ORAL | Status: DC
  Administered 2018-06-10 – 2018-06-13 (×3): 2 via ORAL
  Filled 2018-06-10 (×3): qty 2

## 2018-06-10 MED ORDER — MORPHINE SULFATE (PF) 0.5 MG/ML IJ SOLN
INTRAMUSCULAR | Status: DC | PRN
  Administered 2018-06-10: 4 mg via EPIDURAL

## 2018-06-10 MED ORDER — SCOPOLAMINE 1 MG/3DAYS TD PT72
MEDICATED_PATCH | TRANSDERMAL | Status: AC
  Filled 2018-06-10: qty 1

## 2018-06-10 MED ORDER — SIMETHICONE 80 MG PO CHEW: 80.0000 mg | CHEWABLE_TABLET | ORAL | Status: DC | PRN

## 2018-06-10 MED ORDER — KETOROLAC TROMETHAMINE 30 MG/ML IJ SOLN
INTRAMUSCULAR | Status: AC
  Filled 2018-06-10: qty 1

## 2018-06-10 MED ORDER — TETANUS-DIPHTH-ACELL PERTUSSIS 5-2.5-18.5 LF-MCG/0.5 IM SUSP: 0.5000 mL | Freq: Once | INTRAMUSCULAR | Status: DC

## 2018-06-10 MED ORDER — SODIUM CHLORIDE 0.9% FLUSH: 3.0000 mL | INTRAVENOUS | Status: DC | PRN

## 2018-06-10 MED ORDER — DIPHENHYDRAMINE HCL 25 MG PO CAPS: 25.0000 mg | ORAL_CAPSULE | Freq: Four times a day (QID) | ORAL | Status: DC | PRN

## 2018-06-10 MED ORDER — LACTATED RINGERS IV SOLN
INTRAVENOUS | Status: DC
  Administered 2018-06-10: 20:00:00 via INTRAVENOUS

## 2018-06-10 MED ORDER — FENTANYL CITRATE (PF) 100 MCG/2ML IJ SOLN
INTRAMUSCULAR | Status: AC
  Filled 2018-06-10: qty 2

## 2018-06-10 MED ORDER — DIPHENHYDRAMINE HCL 25 MG PO CAPS
25.0000 mg | ORAL_CAPSULE | ORAL | Status: DC | PRN
  Administered 2018-06-11: 25 mg via ORAL
  Filled 2018-06-10: qty 1

## 2018-06-10 MED ORDER — NALBUPHINE HCL 10 MG/ML IJ SOLN: 5.0000 mg | INTRAMUSCULAR | Status: DC | PRN

## 2018-06-10 MED ORDER — OXYCODONE HCL 5 MG PO TABS
5.0000 mg | ORAL_TABLET | ORAL | Status: DC | PRN
  Administered 2018-06-11 – 2018-06-12 (×3): 5 mg via ORAL
  Administered 2018-06-12: 10 mg via ORAL
  Administered 2018-06-13 (×2): 5 mg via ORAL
  Filled 2018-06-10: qty 2
  Filled 2018-06-10 (×5): qty 1

## 2018-06-10 MED ORDER — FENTANYL CITRATE (PF) 100 MCG/2ML IJ SOLN
INTRAMUSCULAR | Status: DC | PRN
  Administered 2018-06-10: 100 ug via EPIDURAL

## 2018-06-10 MED ORDER — NALBUPHINE HCL 10 MG/ML IJ SOLN: 5.0000 mg | Freq: Once | INTRAMUSCULAR | Status: DC | PRN

## 2018-06-10 MED ORDER — SODIUM CHLORIDE 0.9 % IV SOLN
INTRAVENOUS | Status: DC | PRN
  Administered 2018-06-10: 40 [IU] via INTRAVENOUS

## 2018-06-10 MED ORDER — SCOPOLAMINE 1 MG/3DAYS TD PT72: 1.0000 | MEDICATED_PATCH | Freq: Once | TRANSDERMAL | Status: DC

## 2018-06-10 MED ORDER — ONDANSETRON HCL 4 MG/2ML IJ SOLN
INTRAMUSCULAR | Status: DC | PRN
  Administered 2018-06-10: 4 mg via INTRAVENOUS

## 2018-06-10 MED ORDER — NALOXONE HCL 4 MG/10ML IJ SOLN
1.0000 ug/kg/h | INTRAVENOUS | Status: DC | PRN
  Filled 2018-06-10: qty 5

## 2018-06-10 MED ORDER — SODIUM CHLORIDE 0.9 % IV SOLN
INTRAVENOUS | Status: DC | PRN
  Administered 2018-06-10: 13:00:00 via INTRAVENOUS

## 2018-06-10 MED ORDER — ZOLPIDEM TARTRATE 5 MG PO TABS: 5.0000 mg | ORAL_TABLET | Freq: Every evening | ORAL | Status: DC | PRN

## 2018-06-10 MED ORDER — LACTATED RINGERS IV SOLN
INTRAVENOUS | Status: DC | PRN
  Administered 2018-06-10: 12:00:00 via INTRAVENOUS

## 2018-06-10 MED ORDER — SODIUM CHLORIDE 0.9 % IV SOLN
INTRAVENOUS | Status: AC
  Filled 2018-06-10: qty 500

## 2018-06-10 MED ORDER — ONDANSETRON HCL 4 MG/2ML IJ SOLN
INTRAMUSCULAR | Status: AC
  Filled 2018-06-10: qty 2

## 2018-06-10 MED ORDER — METOCLOPRAMIDE HCL 5 MG/ML IJ SOLN
INTRAMUSCULAR | Status: DC | PRN
  Administered 2018-06-10: 10 mg via INTRAVENOUS

## 2018-06-10 MED ORDER — CEFAZOLIN SODIUM-DEXTROSE 2-4 GM/100ML-% IV SOLN: 2.0000 g | INTRAVENOUS | Status: DC

## 2018-06-10 MED ORDER — MEPERIDINE HCL 25 MG/ML IJ SOLN
INTRAMUSCULAR | Status: DC | PRN
  Administered 2018-06-10 (×2): 12.5 mg via INTRAVENOUS

## 2018-06-10 MED ORDER — SODIUM CHLORIDE 0.9 % IV SOLN
500.0000 mg | Freq: Once | INTRAVENOUS | Status: AC
  Administered 2018-06-10: 500 mg via INTRAVENOUS
  Filled 2018-06-10: qty 500

## 2018-06-10 MED ORDER — MORPHINE SULFATE (PF) 0.5 MG/ML IJ SOLN
INTRAMUSCULAR | Status: AC
  Filled 2018-06-10: qty 10

## 2018-06-10 MED ORDER — NALBUPHINE HCL 10 MG/ML IJ SOLN
5.0000 mg | INTRAMUSCULAR | Status: DC | PRN
  Administered 2018-06-10: 5 mg via INTRAVENOUS
  Filled 2018-06-10: qty 1

## 2018-06-10 MED ORDER — SODIUM CHLORIDE 0.9 % IR SOLN
Status: DC | PRN
  Administered 2018-06-10: 1000 mL

## 2018-06-10 MED ORDER — STERILE WATER FOR IRRIGATION IR SOLN
Status: DC | PRN
  Administered 2018-06-10: 1000 mL

## 2018-06-10 MED ORDER — KETOROLAC TROMETHAMINE 30 MG/ML IJ SOLN
30.0000 mg | Freq: Once | INTRAMUSCULAR | Status: AC | PRN
  Administered 2018-06-10: 30 mg via INTRAVENOUS

## 2018-06-10 MED ORDER — WITCH HAZEL-GLYCERIN EX PADS: 1.0000 "application " | MEDICATED_PAD | CUTANEOUS | Status: DC | PRN

## 2018-06-10 MED ORDER — HYDROMORPHONE HCL 1 MG/ML IJ SOLN: 0.2500 mg | INTRAMUSCULAR | Status: DC | PRN

## 2018-06-10 MED ORDER — OXYTOCIN 40 UNITS IN NORMAL SALINE INFUSION - SIMPLE MED: 2.5000 [IU]/h | INTRAVENOUS | Status: AC

## 2018-06-10 MED ORDER — DIPHENHYDRAMINE HCL 50 MG/ML IJ SOLN: 12.5000 mg | INTRAMUSCULAR | Status: DC | PRN

## 2018-06-10 MED ORDER — SIMETHICONE 80 MG PO CHEW
80.0000 mg | CHEWABLE_TABLET | Freq: Three times a day (TID) | ORAL | Status: DC
  Administered 2018-06-10 – 2018-06-13 (×8): 80 mg via ORAL
  Filled 2018-06-10 (×8): qty 1

## 2018-06-10 MED ORDER — METOCLOPRAMIDE HCL 5 MG/ML IJ SOLN
INTRAMUSCULAR | Status: AC
  Filled 2018-06-10: qty 2

## 2018-06-10 MED ORDER — ONDANSETRON HCL 4 MG/2ML IJ SOLN: 4.0000 mg | Freq: Three times a day (TID) | INTRAMUSCULAR | Status: DC | PRN

## 2018-06-10 MED ORDER — CEFAZOLIN SODIUM-DEXTROSE 2-3 GM-%(50ML) IV SOLR
INTRAVENOUS | Status: DC | PRN
  Administered 2018-06-10: 2 g via INTRAVENOUS

## 2018-06-10 SURGICAL SUPPLY — 36 items
APL SKNCLS STERI-STRIP NONHPOA (GAUZE/BANDAGES/DRESSINGS) ×1
BENZOIN TINCTURE PRP APPL 2/3 (GAUZE/BANDAGES/DRESSINGS) ×2 IMPLANT
CHLORAPREP W/TINT 26ML (MISCELLANEOUS) ×2 IMPLANT
CLAMP CORD UMBIL (MISCELLANEOUS) IMPLANT
CLOTH BEACON ORANGE TIMEOUT ST (SAFETY) ×2 IMPLANT
DRSG OPSITE POSTOP 4X10 (GAUZE/BANDAGES/DRESSINGS) ×2 IMPLANT
ELECT REM PT RETURN 9FT ADLT (ELECTROSURGICAL) ×2
ELECTRODE REM PT RTRN 9FT ADLT (ELECTROSURGICAL) ×1 IMPLANT
EXTRACTOR VACUUM M CUP 4 TUBE (SUCTIONS) IMPLANT
GLOVE BIOGEL PI IND STRL 7.0 (GLOVE) ×2 IMPLANT
GLOVE BIOGEL PI IND STRL 7.5 (GLOVE) ×2 IMPLANT
GLOVE BIOGEL PI INDICATOR 7.0 (GLOVE) ×2
GLOVE BIOGEL PI INDICATOR 7.5 (GLOVE) ×2
GLOVE ECLIPSE 7.5 STRL STRAW (GLOVE) ×2 IMPLANT
GOWN STRL REUS W/TWL LRG LVL3 (GOWN DISPOSABLE) ×6 IMPLANT
HOVERMATT SINGLE USE (MISCELLANEOUS) ×1 IMPLANT
KIT ABG SYR 3ML LUER SLIP (SYRINGE) IMPLANT
NDL HYPO 25X5/8 SAFETYGLIDE (NEEDLE) IMPLANT
NEEDLE HYPO 25X5/8 SAFETYGLIDE (NEEDLE) IMPLANT
NS IRRIG 1000ML POUR BTL (IV SOLUTION) ×2 IMPLANT
PACK C SECTION WH (CUSTOM PROCEDURE TRAY) ×2 IMPLANT
PAD OB MATERNITY 4.3X12.25 (PERSONAL CARE ITEMS) ×2 IMPLANT
PENCIL SMOKE EVAC W/HOLSTER (ELECTROSURGICAL) ×2 IMPLANT
RETRACTOR TRAXI PANNICULUS (MISCELLANEOUS) IMPLANT
RTRCTR C-SECT PINK 25CM LRG (MISCELLANEOUS) ×2 IMPLANT
STRIP CLOSURE SKIN 1/2X4 (GAUZE/BANDAGES/DRESSINGS) ×2 IMPLANT
SUT VIC AB 0 CT1 36 (SUTURE) ×2 IMPLANT
SUT VIC AB 0 CTX 36 (SUTURE) ×4
SUT VIC AB 0 CTX36XBRD ANBCTRL (SUTURE) ×2 IMPLANT
SUT VIC AB 2-0 CT1 27 (SUTURE) ×2
SUT VIC AB 2-0 CT1 TAPERPNT 27 (SUTURE) ×1 IMPLANT
SUT VIC AB 4-0 KS 27 (SUTURE) ×2 IMPLANT
TOWEL OR 17X24 6PK STRL BLUE (TOWEL DISPOSABLE) ×2 IMPLANT
TRAXI PANNICULUS RETRACTOR (MISCELLANEOUS) ×1
TRAY FOLEY W/BAG SLVR 14FR LF (SET/KITS/TRAYS/PACK) ×2 IMPLANT
WATER STERILE IRR 1000ML POUR (IV SOLUTION) ×2 IMPLANT

## 2018-06-10 NOTE — Progress Notes (Signed)
OB/[facility]N Faculty Practice: Labor Progress Note  Subjective: Called to room because feeling lots of rectal pressure and urge to push.   Objective: BP (!) 143/74   Pulse 92   Temp 98.7 F (37.1 C) (Oral)   Resp 17   Ht 5\' 7"  (1.702 m)   Wt 117.2 kg   LMP 09/07/2017 (Approximate)   SpO2 98%   BMI 40.47 kg/m  Gen: uncomfortable appearing  Dilation: 10 Effacement (%): 90 Cervical Position: Posterior Station: 0 Presentation: Vertex Exam by:: L Nuh Lipton DO  Assessment and Plan: 28 y.o. G1P0 [redacted]w[redacted]d here for IOL for chronic HTN.  Labor: Complete and 0 station! Tried a few practice pushes but no fetal descent so will plan to labor down for about an hour then start pushing.  -- pain control: epidural -- PPH Risk: medium  Fetal Well-Being: EFW LGA 3913g >90% at 38w1. Cephalic by sutures.  -- Category I - continuous fetal monitoring  -- GBS negative  CHTN: BP moderate range.  UPC 0.17, labs wnl. Asymptomatic at this time. -- continue to monitor closely   Rowynn Mcweeney S. Earlene Plater, DO OB/GYN Fellow, Faculty Practice  5:10 AM

## 2018-06-10 NOTE — Op Note (Signed)
Cesarean Section Operative Report  Tami Swanson  06/10/2018  Indications: second stage arrest   Pre-operative Diagnosis: arrest of descent.   Post-operative Diagnosis: Same   Surgeon: Surgeon(s) and Role:    * Jermery Caratachea, Wilfred Curtis, MD - Primary    * Hermina Staggers, MD - Assisting   Anesthesia: epidural    Estimated Blood Loss: 199 ml  Total IV Fluids: 1100 ml LR  Urine Output:: 450 ml blood-tinged urine (present prior to start of procedure)  Specimens: none  Findings: Viable female infant in cephalic presentation; Apgars pending; weight pending g; arterial cord pH not obtained; clear amniotic fluid; intact placenta with three vessel cord; normal uterus, fallopian tubes and ovaries bilaterally.  Baby condition / location:  Couplet care / Skin to Skin   Complications: no complications  Indications: Tami Swanson is a 28 y.o. G1P0 with an IUP [redacted]w[redacted]d presenting for induction of labor for chronic hypertension. Arrest of second stage - see progress note for details.  The risks, benefits, complications, treatment options, and exected outcomes were discussed with the patient . The patient dwith the proposed plan, giving informed consent. identified as [name] and the procedure verified as C-Section Delivery.  Procedure Details:  The patient was taken back to the operative suite where epidural anesthesia was dosed.  A time out was held and the above information confirmed.   After induction of anesthesia, the patient was draped and prepped in the usual sterile manner and placed in a dorsal supine position with a leftward tilt. A Pfannenstiel incision was made and carried down through the subcutaneous tissue to the fascia. Fascial incision was made and sharply extended transversely. The fascia was separated from the underlying rectus tissue superiorly and inferiorly. The peritoneum was identified and bluntly entered and extended longitudinally. Alexis retractor was placed. A low  transverse uterine incision was made and extended bluntly. Delivered from cephalic presentation was a viable infant with Apgars and weight as above.  After waiting 60 seconds for delayed cord cutting, the umbilical cord was clamped and cut cord blood was obtained for evaluation. Cord ph was not sent. The placenta was removed Intact and appeared normal. The uterine outline, tubes and ovaries appeared normal. Lateral hysterotomy extension noted on maternal left. Care taken when placing initial sutures in that location. The uterine incision was closed with running locked sutures of 0Vicryl with an imbricating layer of the same.   Hemostasis was observed. The peritoneum was closed with 0 vicryl. The rectus muscles were examined and hemostasis observed. The fascia was then reapproximated with running sutures of 2-0Vicryl. The subcuticular closure was performed using 2-0plain gut. The skin was closed with 4-0Vicryl.   Instrument, sponge, and needle counts were correct prior the abdominal closure and were correct at the conclusion of the case.     Disposition: PACU - hemodynamically stable.   Maternal Condition: stable       Signed: Lavonne Chick 06/10/2018 1:58 PM

## 2018-06-10 NOTE — Progress Notes (Signed)
OB/[facility]N Faculty Practice: Labor Progress Note  Subjective: Uncomfortable, feeling lots of rectal pressure and urge to have BM.   Objective: BP (!) 153/83   Pulse (!) 108   Temp 98.7 F (37.1 C) (Oral)   Resp 20   Ht 5\' 7"  (1.702 m)   Wt 117.2 kg   LMP 09/07/2017 (Approximate)   SpO2 98%   BMI 40.47 kg/m  Gen: uncomfortable appearing during contractions  Dilation: 7 Effacement (%): 90 Cervical Position: Posterior Station: -2 Presentation: Vertex Exam by:: Dr. Earlene Plater L.  Assessment and Plan: 28 y.o. G1P0 [redacted]w[redacted]d here for IOL for chronic HTN.  Labor: Some interval change but still no fetal descent. Cervix mostly on right - some swelling noted. Suspect infant is OP. Will try some position changes, continue to titrate pitocin prn for adequate contractions.  -- pain control: desires epidural -- PPH Risk: medium  Fetal Well-Being: EFW LGA 3913g >90% at 38w1. Cephalic by sutures.  -- Category I - continuous fetal monitoring  -- GBS negative  CHTN: BP moderate range.  UPC 0.17, labs wnl. Asymptomatic at this time. -- continue to monitor closely   Ledger Heindl S. Earlene Plater, DO OB/GYN Fellow, Faculty Practice  2:21 AM

## 2018-06-10 NOTE — Anesthesia Postprocedure Evaluation (Signed)
Anesthesia Post Note  Patient: Tami Swanson  Procedure(s) Performed: CESAREAN SECTION (N/A Abdomen)     Patient location during evaluation: Mother Baby Anesthesia Type: Epidural Level of consciousness: awake and alert Pain management: pain level controlled Vital Signs Assessment: post-procedure vital signs reviewed and stable Respiratory status: spontaneous breathing, nonlabored ventilation and respiratory function stable Cardiovascular status: stable Postop Assessment: no headache, no backache and epidural receding Anesthetic complications: no    Last Vitals:  Vitals:   06/10/18 1510 06/10/18 1621  BP: 133/82 139/78  Pulse: 85 81  Resp:    Temp: 36.7 C 36.9 C  SpO2:      Last Pain:  Vitals:   06/10/18 1621  TempSrc:   PainSc: 0-No pain   Pain Goal:                   [employer]

## 2018-06-10 NOTE — Addendum Note (Signed)
Addendum  created 06/10/18 1747 by Orlie Pollen, CRNA   Clinical Note Signed

## 2018-06-10 NOTE — Progress Notes (Addendum)
LABOR PROGRESS NOTE  Tami Swanson is a 28 y.o. G1P0 at [redacted]w[redacted]d  admitted for iol for chtn  Subjective: Moderately painful contractions. No bleeding. Very tired, tearful  Objective: BP 131/77   Pulse 88   Temp 98.6 F (37 C) (Oral)   Resp 18   Ht 5\' 7"  (1.702 m)   Wt 117.2 kg   LMP 09/07/2017 (Approximate)   SpO2 98%   BMI 40.47 kg/m  or  Vitals:   06/10/18 0901 06/10/18 0911 06/10/18 0920 06/10/18 0931  BP: 124/75   131/77  Pulse: 91   88  Resp: 20 18 20 18   Temp: 98.6 F (37 C)     TempSrc: Oral     SpO2:      Weight:      Height:        135/mod/+a/occ late decel shallow Dilation: 10 Dilation Complete Date: 06/10/18 Dilation Complete Time: 0406 Effacement (%): 90 Cervical Position: Posterior Station: 0 Presentation: Vertex Exam by:: Dr. Ashok Pall  Labs: Lab Results  Component Value Date   WBC 10.1 06/09/2018   HGB 11.7 (L) 06/09/2018   HCT 34.8 (L) 06/09/2018   MCV 87.0 06/09/2018   PLT 245 06/09/2018    Patient Active Problem List   Diagnosis Date Noted  . HTN complicating peripregnancy, antepartum 06/07/2018  . Chronic hypertension complicating pregnancy, antepartum 12/04/2017  . Obesity (BMI 30-39.9) 12/04/2017  . Obesity in pregnancy 12/04/2017  . Condyloma acuminata of vulva in pregnancy, unspecified trimester 12/03/2017  . History of genital warts 12/03/2017  . Hirsutism 12/03/2017  . Supervision of high risk pregnancy, antepartum 12/03/2017  . Tobacco dependence syndrome 06/19/2017    Assessment / Plan: 27 y.o. G1P0 at [redacted]w[redacted]d here for iol for chtn  # chtn - recent bps wnl, no s/s severe disease  Labor: complete for about 4 hours, has pushed for a total of about 2. 0 station, no sig descent since start of pushing. Primiparous, epidural in place. LGA infant, EFW ~4 kg. Unable to assess station given caput and pt did not tolerate exam to locate fetal ears. Occasional late decel but overall fetal status reassuring. Shared decision to attempt another  hour or so of pushing, if no sig progress, proceed to c/s Fetal Wellbeing:  Category 2 Pain Control:  epidural Anticipated MOD:  Unclear at this time  Silvano Bilis, MD 06/10/2018, 9:47 AM    Update 11:41 AM Patient has now pushed for about 4 hours. Category 2 tracing (occasional late decel), but overall fetal status reassuring as good variability and occasional accel. Fetal station unchanged at 0. Shared decision to proceed to c/s for second stage arrest. PreE still w/o severe features.  The risks of cesarean section were discussed with the patient including but were not limited to: bleeding which may require transfusion or reoperation; infection which may require antibiotics; injury to bowel, bladder, ureters or other surrounding organs; injury to the fetus; need for additional procedures including hysterectomy in the event of a life-threatening hemorrhage; placental abnormalities wth subsequent pregnancies, incisional problems, thromboembolic phenomenon and other postoperative/anesthesia complications. Anesthesia and OR aware.  Preoperative prophylactic antibiotics and SCDs ordered on call to the OR.  To OR when ready.

## 2018-06-10 NOTE — Anesthesia Postprocedure Evaluation (Signed)
Anesthesia Post Note  Patient: Tami Swanson  Procedure(s) Performed: CESAREAN SECTION (N/A Abdomen)     Patient location during evaluation: PACU Anesthesia Type: Epidural Level of consciousness: awake Pain management: pain level controlled Vital Signs Assessment: post-procedure vital signs reviewed and stable Respiratory status: spontaneous breathing Cardiovascular status: stable Postop Assessment: no headache, epidural receding, patient able to bend at knees, no apparent nausea or vomiting and no backache Anesthetic complications: no    Last Vitals:  Vitals:   06/10/18 1438 06/10/18 1442  BP:    Pulse: 85 79  Resp: (!) 24 17  Temp:    SpO2: 97% 97%    Last Pain:  Vitals:   06/10/18 1434  TempSrc:   PainSc: 0-No pain   Pain Goal:                Epidural/Spinal Function Cutaneous sensation: Able to Discern Pressure (06/10/18 1432), Patient able to flex knees: Yes (06/10/18 1432), Patient able to lift hips off bed: Yes (06/10/18 1432), Back pain beyond tenderness at insertion site: No (06/10/18 1432), Progressively worsening motor and/or sensory loss: No (06/10/18 1432), Bowel and/or bladder incontinence post epidural: No (06/10/18 1432)  Caren Macadam

## 2018-06-10 NOTE — Transfer of Care (Signed)
Immediate Anesthesia Transfer of Care Note  Patient: Tami Swanson  Procedure(s) Performed: CESAREAN SECTION (N/A Abdomen)  Patient Location: PACU  Anesthesia Type:Epidural  Level of Consciousness: awake, alert , oriented and patient cooperative  Airway & Oxygen Therapy: Patient Spontanous Breathing  Post-op Assessment: Report given to RN, Post -op Vital signs reviewed and stable and Patient moving all extremities X 4  Post vital signs: Reviewed and stable  Last Vitals:  Vitals Value Taken Time  BP 98/81 06/10/2018  1:49 PM  Temp    Pulse 109 06/10/2018  1:50 PM  Resp 18 06/10/2018  1:50 PM  SpO2 99 % 06/10/2018  1:50 PM  Vitals shown include unvalidated device data.  Last Pain:  Vitals:   06/10/18 1220  TempSrc:   PainSc: 0-No pain         Complications: No apparent anesthesia complications

## 2018-06-11 LAB — CBC
HCT: 29.5 % — ABNORMAL LOW (ref 36.0–46.0)
Hemoglobin: 9.9 g/dL — ABNORMAL LOW (ref 12.0–15.0)
MCH: 29.3 pg (ref 26.0–34.0)
MCHC: 33.6 g/dL (ref 30.0–36.0)
MCV: 87.3 fL (ref 80.0–100.0)
Platelets: 234 10*3/uL (ref 150–400)
RBC: 3.38 MIL/uL — ABNORMAL LOW (ref 3.87–5.11)
RDW: 13.2 % (ref 11.5–15.5)
WBC: 19.6 10*3/uL — ABNORMAL HIGH (ref 4.0–10.5)
nRBC: 0 % (ref 0.0–0.2)

## 2018-06-11 NOTE — Progress Notes (Signed)
Subjective: Postpartum Day 1: Cesarean Delivery Patient reports pain doing well, getting up and moving around more.   Objective: Vital signs in last 24 hours: Temp:  [98 F (36.7 C)-98.7 F (37.1 C)] 98 F (36.7 C) (04/16 0830) Pulse Rate:  [72-107] 92 (04/16 0830) Resp:  [14-31] 16 (04/16 0830) BP: (115-139)/(60-93) 116/60 (04/16 0830) SpO2:  [95 %-99 %] 95 % (04/16 0830)  Physical Exam:  General: alert, well-appearing, NAD, sitting up on couch Lochia: appropriate Uterine Fundus: firm Incision: dressing C/D/I DVT Evaluation: 1+ pitting edema up to mid-thigh  Recent Labs    06/10/18 1547 06/11/18 0542  HGB 11.8* 9.9*  HCT 35.5* 29.5*    Assessment/Plan: Status post Cesarean section. Doing well postoperatively.  Continue current care. HgB stable Plan to discharge home tomorrow   Tamera Stands, DO 06/11/2018, 9:38 AM

## 2018-06-11 NOTE — Lactation Note (Signed)
This note was copied from a baby's chart. Lactation Consultation Note  Patient Name: Tami Rihannah Vince BFXOV'A Date: 06/11/2018 Reason for consult: Mother's request;Term P1, 32 hour female infant , weight loss -4%. Elevated billi. Per mom, infant has not been sustaining latch only states on breast for a few minutes. LC notice mom is short shafted and nipples flatten when compressed. After a few attempts of infant not  sustaining latch,  mom was fitted with 20 mm NS, infant sustained latch for 14 minutes. Per mom, she is feeling better now that infant sustained latch and appears content after this feeding. Mom hand expressed and infant was given 9 ml of colostrum by spoon.  Mom had a DEBP already in room, but it was not set up nor explained to Mom how to use it. Mom was given breastshells to wear to help evert nipple shaft out more mom knows to wear them during the day in bra and not at night.  LC discussed with Mom to pump every 3 hours for 15 minutes on initial setting. Mom was  shown how to use DEBP & how to disassemble, clean, & reassemble parts. Mom's plan:  1. Mom knows to breastfeed according hunger cues, 8 or more times within 24 hours. 2. Mom will use NS while breastfeeding  and techniques to keep infant awake at breast. 3. Mom will use DEBP every 3 hours and give infant back EBM. 4. Mom will continue to do STS as much as possible.  Maternal Data    Feeding Feeding Type: Breast Fed  LATCH Score Latch: Repeated attempts needed to sustain latch, nipple held in mouth throughout feeding, stimulation needed to elicit sucking reflex.  Audible Swallowing: Spontaneous and intermittent  Type of Nipple: Everted at rest and after stimulation(Short shafted and flat when compressed.)  Comfort (Breast/Nipple): Soft / non-tender  Hold (Positioning): Assistance needed to correctly position infant at breast and maintain latch.  LATCH Score: 8  Interventions Interventions: Assisted with  latch;Breast massage;Hand express;Pre-pump if needed;Breast compression;Support pillows;Position options;Expressed milk;Shells;DEBP  Lactation Tools Discussed/Used Tools: Pump;Nipple Shields Nipple shield size: 20 Breast pump type: Double-Electric Breast Pump   Consult Status Consult Status: Follow-up Date: 06/12/18 Follow-up type: In-patient    Tami Swanson 06/11/2018, 11:04 PM

## 2018-06-11 NOTE — Plan of Care (Signed)
  Problem: Activity: Goal: Ability to tolerate increased activity will improve Note:  Discussed the importance of increasing activity and ambulating in hallway today. Also discussed pain medication options for when pain increased. Earl Gala, Linda Hedges [place]

## 2018-06-11 NOTE — Lactation Note (Signed)
This note was copied from a baby's chart. Lactation Consultation Note Baby 11 hrs old. Baby is spitting up and had a choking spell per mom. Mom stated it scare her. Mom is holding baby upright watching him. Noted mom has a lot of facial hair to chin and neck. Has Dx; of Hirsutuism. Mom stated they checked her for PCOS, stated she didn't have it. Mom has "V" shaped breast w/short shaft nipples at the bottom end of breast. Hand expressed colostrum easily. Mom excited. Mom states she feels that BF going well, but at times she doesn't feel baby has deep enough latch. Encouraged to call for assistance to check latch. Newborn behavior, feeding habits, STS, I&O, cluster feeding, breast massage, discussed. Will ask RN to set up DEBP for stimulation. Lactation brochure given.   Patient Name: Tami Swanson [id number] Date: 06/11/2018 Reason for consult: Initial assessment;1st time breastfeeding   Maternal Data Has patient been taught Hand Expression?: Yes Does the patient have breastfeeding experience prior to this delivery?: No  Feeding Feeding Type: Breast Fed  LATCH Score       Type of Nipple: Everted at rest and after stimulation(short shaft)  Comfort (Breast/Nipple): Soft / non-tender        Interventions Interventions: Breast feeding basics reviewed;Breast compression;Support pillows;Breast massage;Position options;Hand express  Lactation Tools Discussed/Used     Consult Status Consult Status: Follow-up Date: 06/11/18 Follow-up type: In-patient    Charyl Dancer 06/11/2018, 12:18 AM

## 2018-06-12 NOTE — Progress Notes (Signed)
Subjective: Postpartum Day 2: Cesarean Delivery Patient reports tolerating PO, + flatus and no problems voiding.   Baby not feeding well. Not ready for D/C.   Objective: Vital signs in last 24 hours: Temp:  [98 F (36.7 C)-98.5 F (36.9 C)] 98.5 F (36.9 C) (04/17 1420) Pulse Rate:  [72-127] 76 (04/17 1437) Resp:  [18-20] 18 (04/17 1420) BP: (132-154)/(71-105) 138/92 (04/17 1437) SpO2:  [97 %-99 %] 99 % (04/17 9735)  Physical Exam:  General: alert, cooperative, appears stated age, no distress and moderately obese Lochia: appropriate Uterine Fundus: firm. Abd mildly distended.  Incision: no significant drainage DVT Evaluation: No evidence of DVT seen on physical exam.  Recent Labs    06/10/18 1547 06/11/18 0542  HGB 11.8* 9.9*  HCT 35.5* 29.5*    Assessment/Plan: Status post Cesarean section. Doing well postoperatively.  Continue current care. Encouraged ambulation.   Dorathy Kinsman 06/12/2018, 4:22 PM

## 2018-06-12 NOTE — Progress Notes (Signed)
Patient is exhausted and tearful, support person has left to get breakfast. Encourage patient to get some sleep today, offered infant to go to the nursery and she didn't want to at this time. Encouraged ambulation and fluids in between caring for the infant and getting rest. Patient and infant repostitioned and made comfortable, pulled down blinds and turned off the lights.

## 2018-06-12 NOTE — Lactation Note (Addendum)
This note was copied from a baby's chart. Lactation Consultation Note  Patient Name: [name] CBJSE'G Date: 06/12/2018   Mom and Elijah STS on arrival. Baby boy [name] now 75 hours old. Baby boy Elijah with 8 percent weight loss and increasing BILI. Mom reports she worked really hard at breastfeeding last night but past his circumsicion today they haven't done well.  Mom reports she did great when lactation was in there, but once she started latching him on herself, her nipples got sore and cracked. Mom reports she pumped with DEBP one time today. Urged to breastfed on cue and 8 or more times a day.  Urged mom to pump when nipples are too sore to latch.Discussed how mom can keep him sts and pump one breast at a time.  Mom reports she may try pumping again later.  Too sore to try and breastfeed right now.  Praised moms bf efforts.  Urged mom to call lactation as needed.  Maternal Data    Feeding    LATCH Score                   Interventions    Lactation Tools Discussed/Used     Consult Status      Jarel Cuadra Michaelle Copas 06/12/2018, 11:18 PM

## 2018-06-13 MED ORDER — IBUPROFEN 600 MG PO TABS: 600.0000 mg | ORAL_TABLET | Freq: Four times a day (QID) | ORAL | 0 refills | Status: DC | PRN

## 2018-06-13 MED ORDER — OXYCODONE HCL 5 MG PO TABS: 5.0000 mg | ORAL_TABLET | Freq: Four times a day (QID) | ORAL | 0 refills | Status: DC | PRN

## 2018-06-13 NOTE — Discharge Summary (Signed)
Postpartum Discharge Summary     Patient Name: Tami Swanson DOB: Jul 03, 1990 MRN: [id number]  Date of admission: 06/07/2018 Delivering Provider: Shonna Chock BEDFORD   Date of discharge: 06/13/2018  Admitting diagnosis: pregnancy Intrauterine pregnancy: [redacted]w[redacted]d     Secondary diagnosis:  Active Problems:   HTN complicating peripregnancy, antepartum  Additional problems: obesity , Body mass index is 40.47 kg/m.      Discharge diagnosis: CHTN       Superimposed PreE without severe features                                                                                         Post partum procedures: none  Augmentation: Pitocin, Cytotec and Foley Balloon  Complications: None  Hospital course:  Induction of Labor With Cesarean Section  28 y.o. yo G1P1001 at [redacted]w[redacted]d was admitted to the hospital 06/07/2018 for induction of labor. Tami Swanson is a 28 y.o. female presenting for IOL 2/2 CHTN in pregnancy Patient had a labor course significant for arrest of descent in second stage .   Update before cesarean11:41 AM on 4/15 Patient has now pushed for about 4 hours. Category 2 tracing (occasional late decel), but overall fetal status reassuring as good variability and occasional accel. Fetal station unchanged at 0. Shared decision to proceed to c/s for second stage arrest. PreE still w/o severe features. . The patient went for cesarean section due to Arrest of Descent, and delivered a Viable infant,06/10/2018  Membrane Rupture Time/Date: 2:30 PM ,06/09/2018   Details of operation can be found in separate operative Note.  Patient had an uncomplicated postpartum course. She is ambulating, tolerating a regular diet, passing flatus, and urinating well.  Patient is discharged home in stable condition on 06/13/18.                                    Magnesium Sulfate recieved: No BMZ received: No  Physical exam  Vitals:   06/12/18 1420 06/12/18 1437 06/12/18 2213 06/13/18 0644  BP: (!) 144/94 (!) 138/92  140/88 123/73  Pulse: 72 76 75 72  Resp: 18  18 20   Temp: 98.5 F (36.9 C)  98.3 F (36.8 C) 98.3 F (36.8 C)  TempSrc: Oral  Oral Oral  SpO2:   98% 98%  Weight:      Height:       General: alert, cooperative and no distress Lochia: appropriate Uterine Fundus: not felt beneath large pannus,  Incision: Healing well with no significant drainage, No significant erythema, Dressing is clean, dry, and intact DVT Evaluation: No evidence of DVT seen on physical exam. Negative Homan's sign. signficant edema, has been present x weeks , not reaching past knees. Labs: Lab Results  Component Value Date   WBC 19.6 (H) 06/11/2018   HGB 9.9 (L) 06/11/2018   HCT 29.5 (L) 06/11/2018   MCV 87.3 06/11/2018   PLT 234 06/11/2018   CBC Latest Ref Rng & Units 06/11/2018 06/10/2018 06/09/2018  WBC 4.0 - 10.5 K/uL 19.6(H) 23.5(H) 10.1  Hemoglobin 12.0 - 15.0 g/dL 8.7(G) 11.8(L) 11.7(L)  Hematocrit 36.0 - 46.0 % 29.5(L) 35.5(L) 34.8(L)  Platelets 150 - 400 K/uL 234 252 245    CMP Latest Ref Rng & Units 06/10/2018  Glucose 70 - 99 mg/dL -  BUN 6 - 20 mg/dL -  Creatinine 6.960.44 - 2.951.00 mg/dL 2.84(X1.43(H)  Sodium 324135 - 401145 mmol/L -  Potassium 3.5 - 5.1 mmol/L -  Chloride 98 - 111 mmol/L -  CO2 22 - 32 mmol/L -  Calcium 8.9 - 10.3 mg/dL -  Total Protein 6.5 - 8.1 g/dL -  Total Bilirubin 0.3 - 1.2 mg/dL -  Alkaline Phos 38 - 027126 U/L -  AST 15 - 41 U/L -  ALT 0 - 44 U/L -    Discharge instruction: per After Visit Summary and "Baby and Me Booklet".  After visit meds:  Allergies as of 06/13/2018   No Known Allergies     Medication List    STOP taking these medications   aspirin EC 81 MG tablet     TAKE these medications   Comfort Fit Maternity Supp Lg Misc Wear daily when ambulating   cyclobenzaprine 10 MG tablet Commonly known as:  FLEXERIL Take 1 tablet (10 mg total) by mouth every 8 (eight) hours as needed for muscle spasms.   ibuprofen 600 MG tablet Commonly known as:  ADVIL Take 1  tablet (600 mg total) by mouth every 6 (six) hours as needed for fever or headache.   oxyCODONE 5 MG immediate release tablet Commonly known as:  Oxy IR/ROXICODONE Take 1 tablet (5 mg total) by mouth every 6 (six) hours as needed for moderate pain or breakthrough pain.   PRENATAL VITAMIN PO Take by mouth.       Diet: routine diet  Activity: Advance as tolerated. Pelvic rest for 6 weeks.   Outpatient follow up:2 weeks Follow up Appt:No future appointments. Follow up Visit: Follow-up Information    [facility] in 2 week(s).   Specialty:  Obstetrics and Gynecology Contact information: [address] [place] [id number] [phone]           Please schedule this patient for Postpartum visit in: 2 wk with the following provider: RN For C/S patients schedule nurse incision check in weeks 2 weeks: yes Low risk pregnancy complicated by: HTN Delivery mode:  CS Anticipated Birth Control:  POPs PP Procedures needed: Incision check  Schedule Integrated BH visit: no      Newborn Data: Live born female  Birth Weight: 8 lb 1.3 oz (3665 g) APGAR: 8, 9  Newborn Delivery   Birth date/time:  06/10/2018 12:58:00 Delivery type:  C-Section, Low Transverse Trial of labor:  Yes C-section categorization:  Primary     Baby Feeding: Breast  Pt using lactation consultant in hosp Disposition:home with mother   06/13/2018 Tilda BurrowJohn V Amarys Sliwinski, MD

## 2018-06-13 NOTE — Lactation Note (Signed)
This note was copied from a baby's chart. Lactation Consultation Note  Patient Name: Tami Swanson [id number] Date: 06/13/2018 Reason for consult: Follow-up assessment;Infant weight loss(5% weight loss/ at 63 hours Bili 9.2 / per mom for now due to soreness pumping and bottle feeding )  Baby is 70 hours old  LC reviewed and updated the doc flow sheets per mom.  Mom has been supplementing with a bottle due to sore nipples and has only pumped x 2 since The DEBP was set up.  LC reviewed supply and demand and the importance of pumping both breast for 15 -20 mins  8-10 times a day to establish and protect milk  Supply. Supply and demand.  Per mom has DEBP at home.  LC recommended when sore  Nipples have improved and she desires to re-latch her baby  She may have to supplement her baby with an appetizer and then latch. Important to take  The baby to the breast calm.  S/S of mastitis reviewed.  LC informed mom of the Virtual BFSG and need to sign up. The breastfeeding help line  And the [facility] appt. By appt.     Maternal Data    Feeding Feeding Type: Formula(mom preparing to feed ) Nipple Type: Slow - flow  LATCH Score                   Interventions Interventions: Breast feeding basics reviewed;DEBP  Lactation Tools Discussed/Used Tools: Pump Nipple shield size: 20 WIC Program: No Pump Review: Milk Storage   Consult Status Consult Status: Complete Date: 06/13/18    Tami Swanson 06/13/2018, 11:39 AM

## 2018-06-13 NOTE — Discharge Instructions (Signed)

## 2018-06-23 ENCOUNTER — Ambulatory Visit (INDEPENDENT_AMBULATORY_CARE_PROVIDER_SITE_OTHER): Payer: BLUE CROSS/BLUE SHIELD | Admitting: *Deleted

## 2018-06-23 ENCOUNTER — Other Ambulatory Visit: Payer: Self-pay

## 2018-06-23 VITALS — BP 126/90 | HR 89

## 2018-06-23 DIAGNOSIS — Z4889 Encounter for other specified surgical aftercare: Secondary | ICD-10-CM

## 2018-06-23 NOTE — Progress Notes (Signed)
Patient seen and assessed by nursing staff.  Agree with documentation and plan.  

## 2018-06-23 NOTE — Progress Notes (Signed)
Pt here today for an incision check for her primary cesarean section from 06/10/2018. Incision intact, no drainage or redness noted. Pt denies any issue at this time. Will follow up with her postpartum exam in 4 weeks.

## 2018-07-08 ENCOUNTER — Other Ambulatory Visit: Payer: Self-pay

## 2018-07-08 ENCOUNTER — Encounter: Payer: Self-pay | Admitting: Obstetrics and Gynecology

## 2018-07-08 ENCOUNTER — Ambulatory Visit (INDEPENDENT_AMBULATORY_CARE_PROVIDER_SITE_OTHER): Payer: BLUE CROSS/BLUE SHIELD | Admitting: Obstetrics and Gynecology

## 2018-07-08 ENCOUNTER — Encounter: Payer: Self-pay | Admitting: Radiology

## 2018-07-08 DIAGNOSIS — B372 Candidiasis of skin and nail: Secondary | ICD-10-CM

## 2018-07-08 MED ORDER — NORETHINDRONE 0.35 MG PO TABS: 1.0000 | ORAL_TABLET | Freq: Every day | ORAL | 3 refills | Status: DC

## 2018-07-08 MED ORDER — NYSTATIN 100000 UNIT/GM EX CREA: 1.0000 "application " | TOPICAL_CREAM | Freq: Two times a day (BID) | CUTANEOUS | 1 refills | Status: DC

## 2018-07-08 NOTE — Progress Notes (Signed)
Obstetrics and Gynecology Postpartum Visit  Appointment Date: 07/08/2018  OBGYN Clinic: [facility]  Primary Care Provider: Patient, No Pcp Per  Chief Complaint:  Chief Complaint  Patient presents with  . Postpartum Care    History of Present Illness: Tami Swanson is a 28 y.o.G1P1001 (Patient's last menstrual period was 09/07/2017 (approximate).), seen for the above chief complaint.  She is s/p pLTCS on 4/15; she was discharged to home on POD#3  Vaginal bleeding or discharge: spotting Breast or formula feeding: formula Intercourse: No  Contraception after delivery: abstinence PP depression s/s: No  Any bowel or bladder issues: No  Pap smear: no abnormalities (date: 2019)  Review of Systems:  as noted in the History of Present Illness.  Medications None  Allergies Patient has no known allergies.  Physical Exam:  BP 131/88   Pulse 70   Wt 238 lb (108 kg)   LMP 09/07/2017 (Approximate)   BMI 37.28 kg/m  Body mass index is 37.28 kg/m. General appearance: Well nourished, well developed female in no acute distress.  Respiratory:   Normal respiratory effort Abdomen: obese, soft, nttp, nd. Incision c/d/i with yeast infection below the pannus Neuro/Psych:  Normal mood and affect.  Skin:  Warm and dry.    Laboratory: none  PP Depression Screening:  EPDS 1  Assessment: doing well  Plan:  Routine care Nystatin cream sent in [place] for [facility]  RTC PRN  Cornelia Copa MD Attending Center for [facility])

## 2018-07-13 ENCOUNTER — Encounter: Payer: Self-pay | Admitting: *Deleted

## 2018-07-14 ENCOUNTER — Other Ambulatory Visit: Payer: Self-pay | Admitting: Obstetrics and Gynecology

## 2018-07-14 ENCOUNTER — Telehealth: Payer: Self-pay | Admitting: *Deleted

## 2018-07-14 MED ORDER — NYSTATIN 100000 UNIT/GM EX POWD: Freq: Two times a day (BID) | CUTANEOUS | 0 refills | Status: AC

## 2018-07-14 NOTE — Telephone Encounter (Signed)
Called pt, Dr Vergie Living will send in RX for powder for pt to try instead of cream. To call us if she has anymore issues.

## 2018-08-25 ENCOUNTER — Encounter: Payer: Self-pay | Admitting: Radiology

## 2018-10-01 ENCOUNTER — Other Ambulatory Visit: Payer: Self-pay

## 2018-10-01 MED ORDER — NORETHINDRONE 0.35 MG PO TABS: 1.0000 | ORAL_TABLET | Freq: Every day | ORAL | 3 refills | Status: DC

## 2018-10-01 NOTE — Telephone Encounter (Signed)
Refill of birth control norethindrone

## 2019-02-23 ENCOUNTER — Other Ambulatory Visit: Payer: Self-pay | Admitting: *Deleted

## 2019-02-23 MED ORDER — NORETHINDRONE 0.35 MG PO TABS: 1.0000 | ORAL_TABLET | Freq: Every day | ORAL | 3 refills | Status: DC

## 2019-03-01 ENCOUNTER — Encounter: Payer: Self-pay | Admitting: *Deleted

## 2019-08-10 ENCOUNTER — Encounter: Payer: Self-pay | Admitting: Registered Nurse

## 2019-08-11 ENCOUNTER — Encounter: Payer: Self-pay | Admitting: Registered Nurse

## 2019-10-07 ENCOUNTER — Ambulatory Visit (INDEPENDENT_AMBULATORY_CARE_PROVIDER_SITE_OTHER): Payer: BC Managed Care – PPO | Admitting: Obstetrics and Gynecology

## 2019-10-07 ENCOUNTER — Encounter: Payer: Self-pay | Admitting: Obstetrics and Gynecology

## 2019-10-07 ENCOUNTER — Other Ambulatory Visit: Payer: Self-pay

## 2019-10-07 VITALS — BP 132/87 | HR 97 | Wt 231.0 lb

## 2019-10-07 DIAGNOSIS — Z0000 Encounter for general adult medical examination without abnormal findings: Secondary | ICD-10-CM | POA: Diagnosis not present

## 2019-10-07 DIAGNOSIS — F419 Anxiety disorder, unspecified: Secondary | ICD-10-CM | POA: Insufficient documentation

## 2019-10-07 DIAGNOSIS — F32A Depression, unspecified: Secondary | ICD-10-CM

## 2019-10-07 DIAGNOSIS — F329 Major depressive disorder, single episode, unspecified: Secondary | ICD-10-CM | POA: Insufficient documentation

## 2019-10-07 DIAGNOSIS — Z862 Personal history of diseases of the blood and blood-forming organs and certain disorders involving the immune mechanism: Secondary | ICD-10-CM

## 2019-10-07 DIAGNOSIS — I10 Essential (primary) hypertension: Secondary | ICD-10-CM | POA: Insufficient documentation

## 2019-10-07 MED ORDER — CITALOPRAM HYDROBROMIDE 20 MG PO TABS: 20.0000 mg | ORAL_TABLET | Freq: Every day | ORAL | 3 refills | Status: DC

## 2019-10-07 NOTE — Progress Notes (Signed)
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  Never has been on meds for anxiety

## 2019-10-07 NOTE — Progress Notes (Signed)
Obstetrics and Gynecology Annual Patient Evaluation  Appointment Date: 10/07/2019  OBGYN Clinic: [facility]  Primary Care Provider: None  Chief Complaint:  Chief Complaint  Patient presents with  . Anxiety  Annual  History of Present Illness: Tami Swanson is a 29 y.o. G1P1001 (Patient's last menstrual period was 09/27/2019 (exact date).), seen for the above chief complaint.  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   Review of Systems: Pertinent items noted in HPI and remainder of comprehensive ROS otherwise negative.    Patient Active Problem List   Diagnosis Date Noted  . Anxiety and depression 10/07/2019  . Hypertension   . Obesity (BMI 30-39.9) 12/04/2017  . Condyloma acuminata of vulva in pregnancy, unspecified trimester 12/03/2017  . History of genital warts 12/03/2017  . Hirsutism 12/03/2017  . Tobacco dependence syndrome 06/19/2017    Past Medical History:  Past Medical History:  Diagnosis Date  . Hypertension     Past Surgical History:  Past Surgical History:  Procedure Laterality Date  . CESAREAN SECTION N/A 06/10/2018   Procedure: CESAREAN SECTION;  Surgeon: Kathrynn Running, MD;  [facility];  Service: Obstetrics;  Laterality: N/A;  . FOOT SURGERY Right 2006    Past Obstetrical History:  OB History  Gravida Para Term Preterm AB Living  1 1 1     1   SAB TAB Ectopic Multiple Live Births        0 1    # Outcome Date GA Lbr Len/2nd Weight Sex Delivery Anes PTL Lv  1 Term 06/10/18 [redacted]w[redacted]d 05:06 / 08:52 8 lb 1.3 oz (3.665 kg) M CS-LTranv EPI  LIV   Past Gynecological History: As per HPI. Periods: qmonth, regular, normal History of Pap Smear(s): Yes.   Last pap 2019, which was neg She is currently using oral progesterone-only contraceptive for contraception.   Social History:  Social History   Socioeconomic History  . Marital status: Single     Spouse name: Not on file  . Number of children: Not on file  . Years of education: Not on file  . Highest education level: Not on file  Occupational History  . Occupation: 2020  Tobacco Use  . Smoking status: Former Smoker    Packs/day: 0.25    Types: Cigarettes  . Smokeless tobacco: Former Market researcher  . Tobacco comment: Only smokes one cigarette a day  Vaping Use  . Vaping Use: Never used  Substance and Sexual Activity  . Alcohol use: Never  . Drug use: No  . Sexual activity: Yes    Birth control/protection: Pill  Other Topics Concern  . Not on file  Social History Narrative   Nursing student at [employer]   Social Determinants of Health   Financial Resource Strain:   . Difficulty of Paying Living Expenses:   Food Insecurity:   . Worried About [employer] in the Last Year:   . Programme researcher, broadcasting/film/video in the Last Year:   Transportation Needs:   . Barista (Medical):   Freight forwarder Lack of Transportation (Non-Medical):   Physical Activity:   . Days of Exercise per Week:   . Minutes of Exercise per Session:   Stress:   . Feeling of Stress :   Social Connections:   . Frequency of Communication with Friends and Family:   . Frequency of Social Gatherings with Friends and Family:   . Attends Religious  Services:   . Active Member of Clubs or Organizations:   . Attends Banker Meetings:   Marland Kitchen Marital Status:   Intimate Partner Violence:   . Fear of Current or Ex-Partner:   . Emotionally Abused:   Marland Kitchen Physically Abused:   . Sexually Abused:     Family History:  Family History  Problem Relation Age of Onset  . Hypertension Father   . Heart disease Maternal Grandfather     Medications Tami Swanson had no medications administered during this visit. Current Outpatient Medications  Medication Sig Dispense Refill  . norethindrone (MICRONOR) 0.35 MG tablet Take 1 tablet (0.35 mg total) by mouth daily. 3 Package 3   No current facility-administered  medications for this visit.    Allergies Patient has no known allergies.   Physical Exam:  BP 132/87   Pulse 97   Wt 231 lb (104.8 kg)   LMP 09/27/2019 (Exact Date)   BMI 36.18 kg/m  Body mass index is 36.18 kg/m. General appearance: Well nourished, well developed female in no acute distress.  Neuro/Psych:  Normal mood and affect.   Patient declines remainder of exam  Laboratory: none  Radiology: none  Assessment: pt stable.  Plan:  1. Anxiety and depression D/w her importance of counseling, identifying life stressors as key to success. I told her that medications can sometimes be useful as an adjunct or temporarily. Potential side effects including worsening of s/s d/w her. She has never been on meds before. Pt amenable to starting. Will start with celexa 20 qday and check basic labs today.  - Amb ref to [facility] - TSH - CBC - Hemoglobin A1c - Comprehensive metabolic panel - Urinalysis, Routine w reflex microscopic - Lipid panel  2. History of anemia  3. Well woman exam (no gynecological exam) Pt doesn't have a PCP; she does have medicaid and will tell her at check out who her assigned one is.   Patient likes the POPs.  - Amb ref to [facility] - TSH - CBC - Hemoglobin A1c - Comprehensive metabolic panel - Urinalysis, Routine w reflex microscopic - Lipid panel  RTC 2wks for mood check  Cornelia Copa MD Attending Center for [facility])

## 2019-10-08 LAB — URINALYSIS, ROUTINE W REFLEX MICROSCOPIC
Bilirubin, UA: NEGATIVE
Glucose, UA: NEGATIVE
Ketones, UA: NEGATIVE
Nitrite, UA: NEGATIVE
Protein,UA: NEGATIVE
RBC, UA: NEGATIVE
Specific Gravity, UA: 1.018 (ref 1.005–1.030)
Urobilinogen, Ur: 0.2 mg/dL (ref 0.2–1.0)
pH, UA: 6 (ref 5.0–7.5)

## 2019-10-08 LAB — LIPID PANEL
Chol/HDL Ratio: 3.2 ratio (ref 0.0–4.4)
Cholesterol, Total: 148 mg/dL (ref 100–199)
HDL: 46 mg/dL (ref >39)
LDL Chol Calc (NIH): 74 mg/dL (ref 0–99)
Triglycerides: 161 mg/dL — ABNORMAL HIGH (ref 0–149)
VLDL Cholesterol Cal: 28 mg/dL (ref 5–40)

## 2019-10-08 LAB — CBC
Hematocrit: 42.6 % (ref 34.0–46.6)
Hemoglobin: 14.3 g/dL (ref 11.1–15.9)
MCH: 30.8 pg (ref 26.6–33.0)
MCHC: 33.6 g/dL (ref 31.5–35.7)
MCV: 92 fL (ref 79–97)
Platelets: 319 10*3/uL (ref 150–450)
RBC: 4.65 x10E6/uL (ref 3.77–5.28)
RDW: 11.8 % (ref 11.7–15.4)
WBC: 9.1 10*3/uL (ref 3.4–10.8)

## 2019-10-08 LAB — COMPREHENSIVE METABOLIC PANEL
ALT: 16 IU/L (ref 0–32)
AST: 17 IU/L (ref 0–40)
Albumin/Globulin Ratio: 1.8 (ref 1.2–2.2)
Albumin: 4.4 g/dL (ref 3.9–5.0)
Alkaline Phosphatase: 96 IU/L (ref 48–121)
BUN/Creatinine Ratio: 13 (ref 9–23)
BUN: 10 mg/dL (ref 6–20)
Bilirubin Total: 0.3 mg/dL (ref 0.0–1.2)
CO2: 21 mmol/L (ref 20–29)
Calcium: 9.7 mg/dL (ref 8.7–10.2)
Chloride: 101 mmol/L (ref 96–106)
Creatinine, Ser: 0.76 mg/dL (ref 0.57–1.00)
GFR calc Af Amer: 122 mL/min/{1.73_m2} (ref >59)
GFR calc non Af Amer: 106 mL/min/{1.73_m2} (ref >59)
Globulin, Total: 2.5 g/dL (ref 1.5–4.5)
Glucose: 82 mg/dL (ref 65–99)
Potassium: 4.2 mmol/L (ref 3.5–5.2)
Sodium: 138 mmol/L (ref 134–144)
Total Protein: 6.9 g/dL (ref 6.0–8.5)

## 2019-10-08 LAB — HEMOGLOBIN A1C
Est. average glucose Bld gHb Est-mCnc: 100 mg/dL
Hgb A1c MFr Bld: 5.1 % (ref 4.8–5.6)

## 2019-10-08 LAB — MICROSCOPIC EXAMINATION: Casts: NONE SEEN /lpf

## 2019-10-08 LAB — TSH: TSH: 1.99 u[IU]/mL (ref 0.450–4.500)

## 2019-10-11 NOTE — BH Specialist Note (Deleted)
Integrated Behavioral Health via Telemedicine Video (Caregility) Visit  10/11/2019 [name] [id number]  Number of Integrated Behavioral Health visits: 1 Session Start time: 1:45***  Session End time: 2:15*** Total time: {IBH Total Time:21014050} minutes  Referring Provider: Jaynie Collins, MD Type of Visit: Video Patient/Family location: Home [facility] Provider location: Center for [facility] at [facility]  All persons participating in visit: Patient *** and [facility] [name] ***   Discussed confidentiality: Yes   I connected with [name] and/or Birdie Riddle {family members:[id number]} by a video enabled telemedicine application (Caregility) and verified that I am speaking with the correct person using two identifiers.    I discussed that engaging in this virtual visit, they consent to the provision of behavioral healthcare and the services will be billed under their insurance.   Patient and/or legal guardian expressed understanding and consented to virtual visit: Yes   PRESENTING CONCERNS: Patient and/or family reports the following symptoms/concerns: *** Duration of problem: ***; Severity of problem: {Mild/Moderate/Severe:20260}  STRENGTHS (Protective Factors/Coping Skills): ***  GOALS ADDRESSED: Patient will: 1.  Reduce symptoms of: {IBH Symptoms:21014056}  2.  Increase knowledge and/or ability of: {IBH Patient Tools:21014057}  3.  Demonstrate ability to: {IBH Goals:21014053}  INTERVENTIONS: Interventions utilized:  {IBH Interventions:21014054} Standardized Assessments completed: {IBH Screening Tools:21014051}  ASSESSMENT: Patient currently experiencing ***.   Patient may benefit from psychoeducation and brief therapeutic interventions regarding coping with symptoms of *** .  PLAN: 1. Follow up with behavioral health clinician on : *** 2. Behavioral recommendations:  -*** -*** 3. Referral(s): {IBH Referrals:21014055}  I discussed the  assessment and treatment plan with the patient and/or parent/guardian. They were provided an opportunity to ask questions and all were answered. They agreed with the plan and demonstrated an understanding of the instructions.   They were advised to call back or seek an in-person evaluation if the symptoms worsen or if the condition fails to improve as anticipated.   Confirmed patient's address: Yes  Confirmed patient's phone number: Yes  Any changes to demographics: No   Confirmed patient's insurance: Yes  Any changes to patient's insurance: No   I discussed the limitations of evaluation and management by telemedicine and the availability of in person appointments.  I discussed that the purpose of this visit is to provide behavioral health care while limiting exposure to the novel coronavirus.   Discussed there is a possibility of technology failure and discussed alternative modes of communication if that failure occurs.  Valetta Close [place]

## 2019-10-18 NOTE — BH Specialist Note (Addendum)
Integrated Behavioral Health via Telemedicine Video (Caregility) Visit  10/18/2019 Tami Swanson [id number]  Number of Integrated Behavioral Health visits: 1 Session Start time: 1:50  Session End time: 2:14 Total time: 24 minutes  Referring Provider: Jaynie Collins, MD Type of Visit: Video Tami Swanson/Family location: Home [facility] Provider location: Center for [facility] Healthcare at [facility]  All persons participating in visit: Tami Swanson Tami Swanson and Tami Swanson    Discussed confidentiality: Yes   Tami Swanson  by a video enabled telemedicine application (Caregility) and verified that Tami am speaking with the correct person using two identifiers.    Tami discussed that engaging in this virtual visit, they consent to the provision of behavioral healthcare and the services will be billed under their insurance.   Tami Swanson and/or legal guardian expressed understanding and consented to virtual visit: Yes   PRESENTING CONCERNS: Tami Swanson and/or family reports the following symptoms/concerns: Tami Swanson states she has noticed a decrease in stress level, and feeling less short-tempered after starting Celexa; Tami Swanson uses deep breathing exercises as self-coping strategy to manage emotions. Tami Swanson would like to try taking Celexa at night instead of in the morning.  Duration of problem: Increase with life changes; Severity of problem: mild  STRENGTHS (Protective Factors/Coping Skills): Good social support; adhering to treatment; uses deep breathing as needed  GOALS ADDRESSED: Tami Swanson will: 1.  Reduce symptoms of: mood instability and stress  2.  Increase knowledge and/or ability of: stress reduction  3.  Demonstrate ability to: Increase healthy adjustment to current life circumstances  INTERVENTIONS: Interventions utilized:  Medication Monitoring, Sleep Hygiene and Psychoeducation and/or Health Education Standardized Assessments completed: GAD-7 and PHQ  9  ASSESSMENT: Tami Swanson currently experiencing Adjustment disorder with anxiety.   Tami Swanson may benefit from psychoeducation and brief therapeutic interventions regarding coping with symptoms of anxiety and life stress .  PLAN: 1. Follow up with behavioral health clinician on : As needed 2. Behavioral recommendations:  -Continue taking Celexa as prescribed (If switch to taking pm instead of am; make switch at a time with no obligations the next 1-2 days to adjust) -Consider setting timer on phone to take Celexa at same time daily -Continue using deep breathing exercises as needed to manage emotions -Consider using apps, as discussed, as an additional self-care strategy 3. Referral(s): Integrated [employer] (In Clinic)  Tami discussed the assessment and treatment plan with the Tami Swanson and/or parent/guardian. They were provided an opportunity to ask questions and all were answered. They agreed with the plan and demonstrated an understanding of the instructions.   They were advised to call back or seek an in-person evaluation if the symptoms worsen or if the condition fails to improve as anticipated.   Confirmed Tami Swanson's address: Yes  Confirmed Tami Swanson's phone number: Yes  Any changes to demographics: No   Confirmed Tami Swanson's insurance: Yes  Any changes to Tami Swanson's insurance: No   Tami discussed the limitations of evaluation and management by telemedicine and the availability of in person appointments.  Tami discussed that the purpose of this visit is to provide behavioral health care while limiting exposure to the novel coronavirus.   Discussed there is a possibility of technology failure and discussed alternative modes of communication if that failure occurs.  Tami Swanson  Depression screen [facility] 2/9 10/25/2019 06/18/2017 06/15/2016  Decreased Interest 0 0 0  Down, Depressed, Hopeless 0 0 0  PHQ - 2 Score 0 0 0  Altered sleeping 1 - -  Tired, decreased  energy 0 - -   Change in appetite 0 - -  Feeling bad or failure about yourself  0 - -  Trouble concentrating 0 - -  Moving slowly or fidgety/restless 0 - -  Suicidal thoughts 0 - -  PHQ-9 Score 1 - -   GAD 7 : Generalized Anxiety Score 10/25/2019  Nervous, Anxious, on Edge 0  Control/stop worrying 0  Worry too much - different things 0  Trouble relaxing 0  Restless 0  Easily annoyed or irritable 0  Afraid - awful might happen 0  Total GAD 7 Score 0

## 2019-10-19 ENCOUNTER — Encounter: Payer: Self-pay | Admitting: Obstetrics and Gynecology

## 2019-10-19 ENCOUNTER — Telehealth: Payer: Medicaid Other | Admitting: Obstetrics and Gynecology

## 2019-10-19 ENCOUNTER — Telehealth: Payer: Self-pay | Admitting: Radiology

## 2019-10-19 NOTE — Progress Notes (Signed)
Patient did not keep her mood check appointment for 10/19/2019.  Cornelia Copa MD Attending Center for [employer] [facility])

## 2019-10-19 NOTE — Telephone Encounter (Signed)
Called patient for virtual visit with Dr Vergie Living, No answer, left voicemail for patient to call [id number]

## 2019-10-25 ENCOUNTER — Ambulatory Visit (INDEPENDENT_AMBULATORY_CARE_PROVIDER_SITE_OTHER): Payer: Medicaid Other | Admitting: Clinical

## 2019-10-25 DIAGNOSIS — Z0000 Encounter for general adult medical examination without abnormal findings: Secondary | ICD-10-CM

## 2019-10-25 DIAGNOSIS — F419 Anxiety disorder, unspecified: Secondary | ICD-10-CM

## 2019-10-25 DIAGNOSIS — F4322 Adjustment disorder with anxiety: Secondary | ICD-10-CM | POA: Diagnosis not present

## 2019-10-25 DIAGNOSIS — F32A Depression, unspecified: Secondary | ICD-10-CM

## 2019-10-25 NOTE — Patient Instructions (Signed)
[facility] [address] [place],  [address] [phone] (main office) [phone] (Ramani Riva's office)  /Emotional Wellbeing Apps and Websites Here are a few free apps meant to help you to help yourself.  To find, try searching on the internet to see if the app is offered on Apple/Android devices. If your first choice doesn't come up on your device, the good news is that there are many choices! Play around with different apps to see which ones are helpful to you.    Calm This is an app meant to help increase calm feelings. Includes info, strategies, and tools for tracking your feelings.      Calm Harm  This app is meant to help with self-harm. Provides many 5-minute or 15-min coping strategies for doing instead of hurting yourself.       Healthy Minds Health Minds is a problem-solving tool to help deal with emotions and cope with stress you encounter wherever you are.      MindShift This app can help people cope with anxiety. Rather than trying to avoid anxiety, you can make an important shift and face it.      MY3  MY3 features a support system, safety plan and resources with the goal of offering a tool to use in a time of need.       My Life My Voice  This mood journal offers a simple solution for tracking your thoughts, feelings and moods. Animated emoticons can help identify your mood.       Relax Melodies Designed to help with sleep, on this app you can mix sounds and meditations for relaxation.      Smiling Mind Smiling Mind is meditation made easy: it's a simple tool that helps put a smile on your mind.        Stop, Breathe & Think  A friendly, simple guide for people through meditations for mindfulness and compassion.  Stop, Breathe and Think Kids Enter your current feelings and choose a "mission" to help you cope. Offers videos for certain moods instead of just sound recordings.       Team  Orange The goal of this tool is to help teens change how they think, act, and react. This app helps you focus on your own good feelings and experiences.      The Virtual Hope Box The Virtual Hope Box (VHB) contains simple tools to help patients with coping, relaxation, distraction, and positive thinking.     

## 2019-11-11 ENCOUNTER — Encounter: Payer: Self-pay | Admitting: Obstetrics and Gynecology

## 2019-11-11 ENCOUNTER — Other Ambulatory Visit: Payer: Self-pay

## 2019-11-11 ENCOUNTER — Telehealth (INDEPENDENT_AMBULATORY_CARE_PROVIDER_SITE_OTHER): Payer: Medicaid Other | Admitting: Obstetrics and Gynecology

## 2019-11-11 DIAGNOSIS — F419 Anxiety disorder, unspecified: Secondary | ICD-10-CM

## 2019-11-11 DIAGNOSIS — F329 Major depressive disorder, single episode, unspecified: Secondary | ICD-10-CM | POA: Diagnosis not present

## 2019-11-11 DIAGNOSIS — F32A Depression, unspecified: Secondary | ICD-10-CM

## 2019-11-11 NOTE — Progress Notes (Signed)
I connected with  [name] on 11/11/19 at 11:15 AM EDT by telephone and verified that I am speaking with the correct person using two identifiers.   I discussed the limitations, risks, security and privacy concerns of performing an evaluation and management service by telephone and the availability of in person appointments. I also discussed with the patient that there may be a patient responsible charge related to this service. The patient expressed understanding and agreed to proceed.  [name], RN 11/11/2019  11:21 AM   Pt states she  is doing much better

## 2019-11-11 NOTE — Progress Notes (Signed)
   TELEHEALTH VIRTUAL GYNECOLOGY VISIT ENCOUNTER NOTE  I connected with [name] on 11/11/19 at 11:15 AM EDT by telephone at home and verified that I am speaking with the correct person using two identifiers.   I discussed the limitations, risks, security and privacy concerns of performing an evaluation and management service by telephone and the availability of in person appointments. I also discussed with the patient that there may be a patient responsible charge related to this service. The patient expressed understanding and agreed to proceed.  Chief Complaint: follow up starting celexa  History:  Tami Swanson is a 29 y.o. G1P1001 being evaluated today for above CC.  She was started on celexa 20mg  qday on 8/12 and she states that she is feeling much better and is doing well with the medicine.      Past Medical History:  Diagnosis Date  . Hypertension    Past Surgical History:  Procedure Laterality Date  . CESAREAN SECTION N/A 06/10/2018   Procedure: CESAREAN SECTION;  Surgeon: 06/12/2018, MD;  Location: [facility] LD ORS;  Service: Obstetrics;  Laterality: N/A;  . FOOT SURGERY Right 2006   The following portions of the patient's history were reviewed and updated as appropriate: allergies, current medications, past family history, past medical history, past social history, past surgical history and problem list.   Review of Systems:  Pertinent items noted in HPI and remainder of comprehensive ROS otherwise negative.  Physical Exam:   General:  Alert, oriented and cooperative.   Mental Status: Normal mood and affect perceived. Normal judgment and thought content.  Physical exam deferred due to nature of the encounter  Labs and Imaging No results found for this or any previous visit (from the past 336 hour(s)). No results found.    Assessment and Plan:     1. Anxiety and depression Continue with current medications. D/w her if she would like continue that's fine or if she  wants to taper to let me know and we can d/w her more.       RTC: PRN  I discussed the assessment and treatment plan with the patient. The patient was provided an opportunity to ask questions and all were answered. The patient agreed with the plan and demonstrated an understanding of the instructions.   The patient was advised to call back or seek an in-person evaluation/go to the ED if the symptoms worsen or if the condition fails to improve as anticipated.  I provided 15 minutes of non-face-to-face time during this encounter. The visit was done via a MyChart visit.    2007, MD Center for [place], [facility]

## 2020-02-11 ENCOUNTER — Other Ambulatory Visit: Payer: Self-pay | Admitting: *Deleted

## 2020-02-11 ENCOUNTER — Telehealth: Payer: Self-pay | Admitting: Radiology

## 2020-02-11 MED ORDER — CITALOPRAM HYDROBROMIDE 20 MG PO TABS: 20.0000 mg | ORAL_TABLET | Freq: Every day | ORAL | 1 refills | Status: DC

## 2020-02-11 NOTE — Telephone Encounter (Signed)
error 

## 2020-02-21 ENCOUNTER — Other Ambulatory Visit: Payer: Self-pay | Admitting: *Deleted

## 2020-02-21 MED ORDER — NORETHINDRONE 0.35 MG PO TABS: 1.0000 | ORAL_TABLET | Freq: Every day | ORAL | 3 refills | Status: DC

## 2020-07-19 ENCOUNTER — Other Ambulatory Visit: Payer: Self-pay | Admitting: Obstetrics and Gynecology

## 2020-08-31 ENCOUNTER — Other Ambulatory Visit: Payer: Self-pay | Admitting: *Deleted

## 2020-08-31 MED ORDER — CITALOPRAM HYDROBROMIDE 20 MG PO TABS: 20.0000 mg | ORAL_TABLET | Freq: Every day | ORAL | 0 refills | Status: DC

## 2020-09-09 IMAGING — US US MFM OB FOLLOW-UP
1 series · 14 of 28 positions shown · non-contrast
Comparison: none

[Series 1: us mfm ob follow-up · 32 acquisitions, 14 frames shown]
[im 2/32]
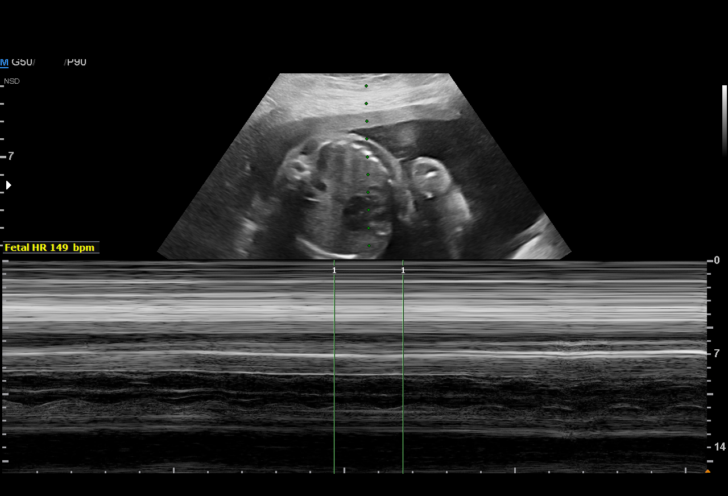
[im 4/32]
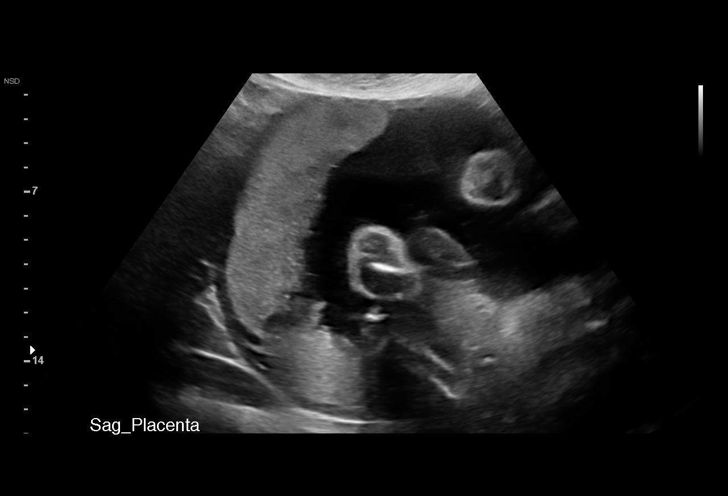
[im 6/32]
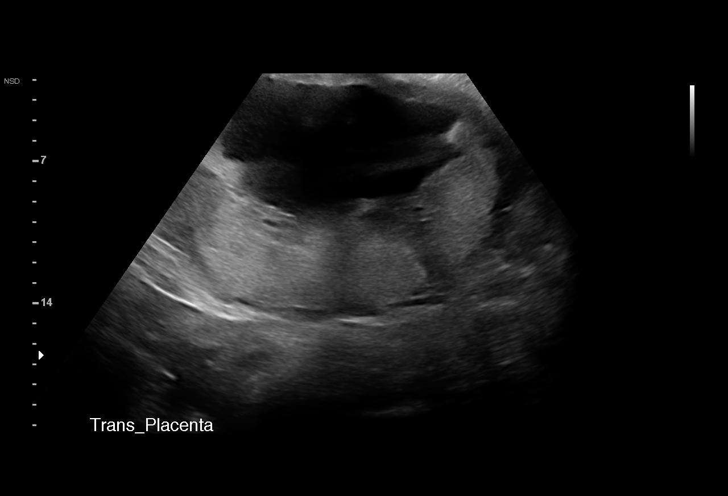
[im 9/32]
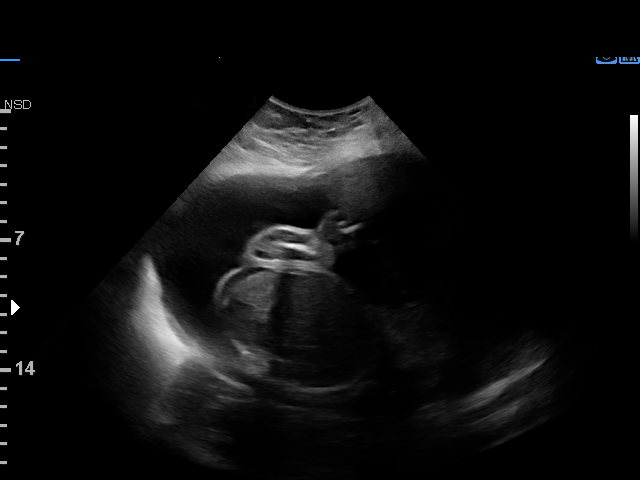
[im 11/32]
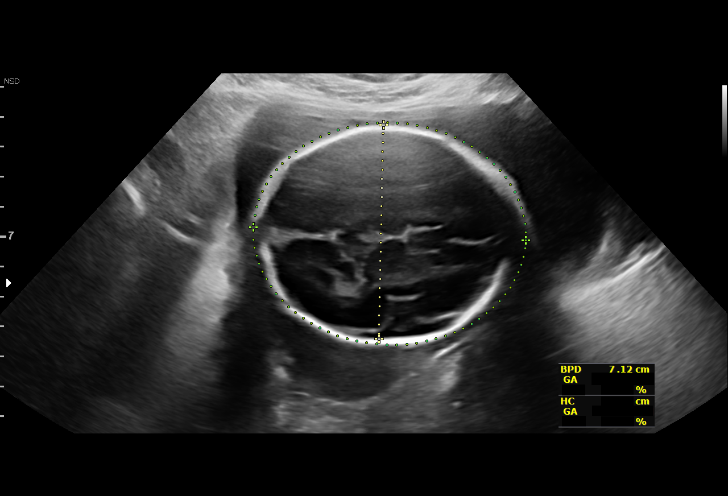
[im 13/32]
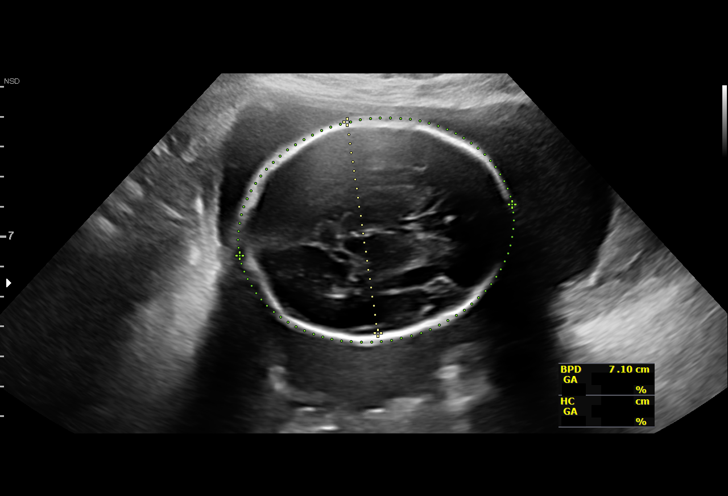
[im 15/32]
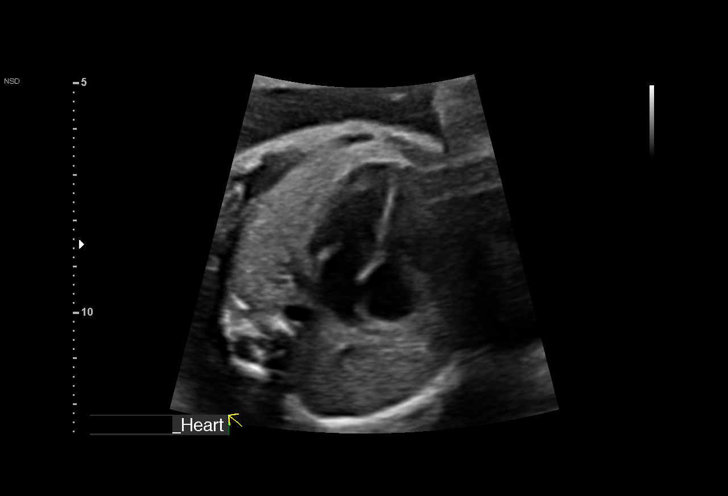
[im 18/32]
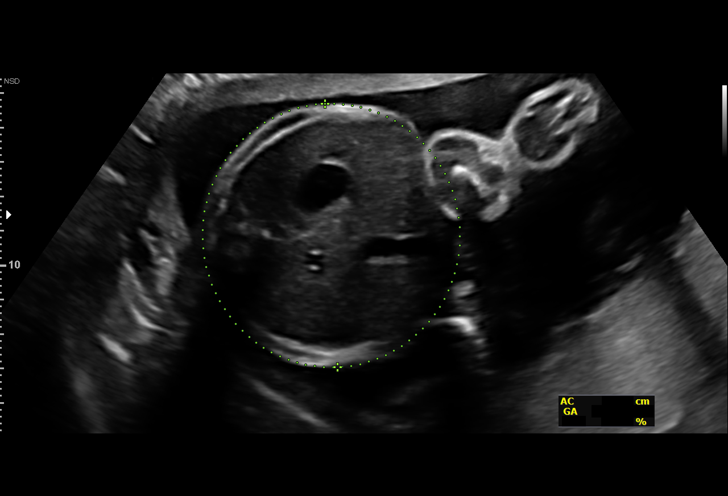
[im 20/32]
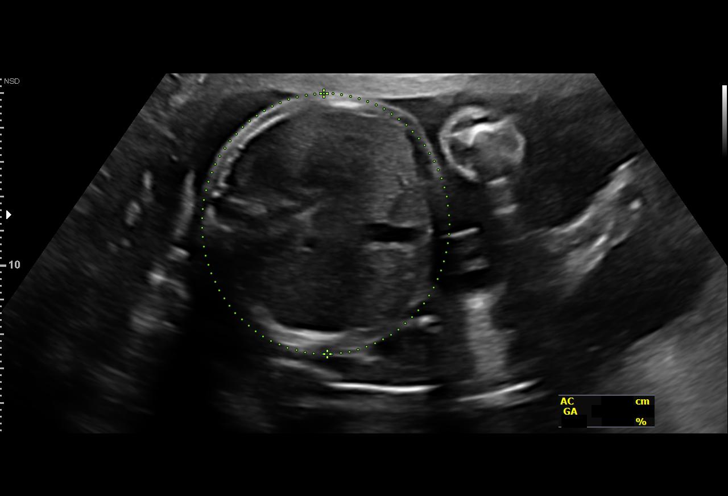
[im 22/32]
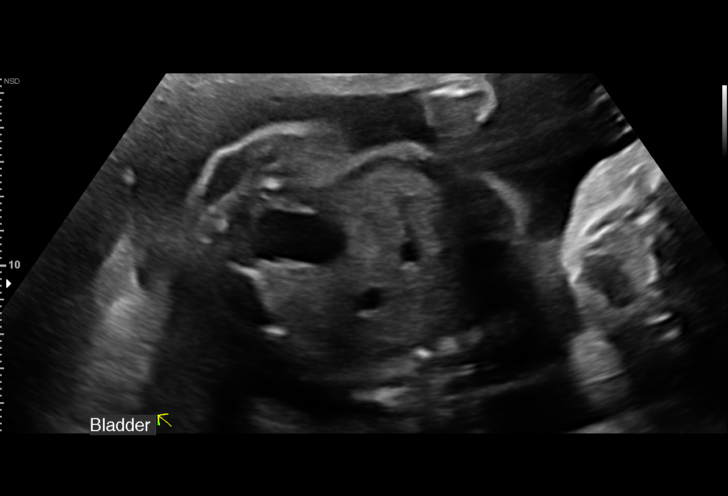
[im 25/32]
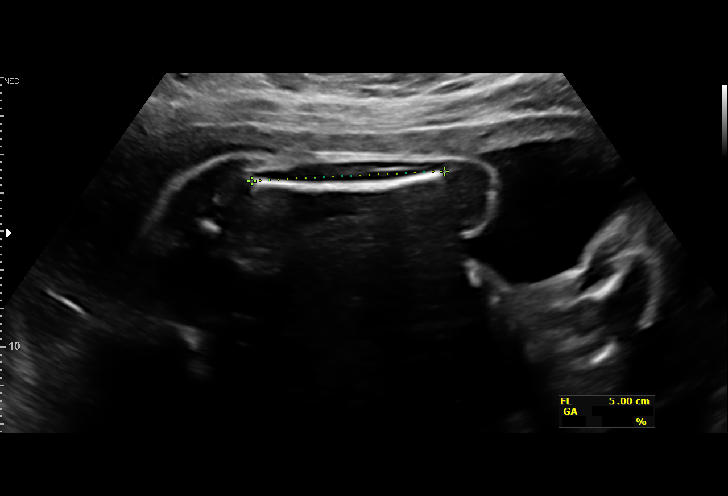
[im 27/32]
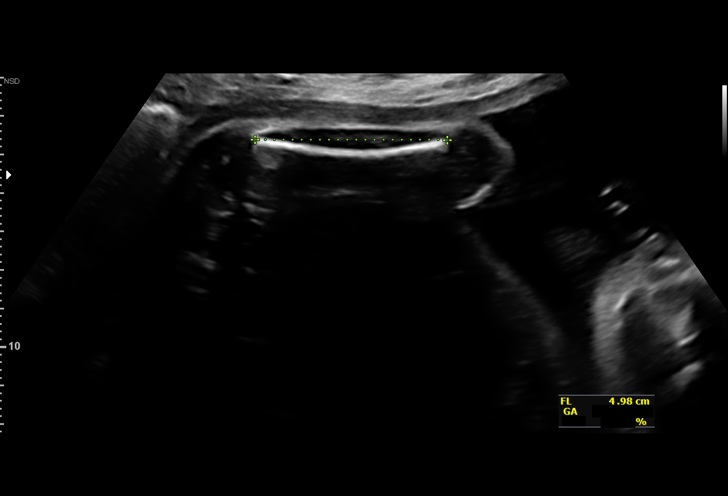
[im 29/32]
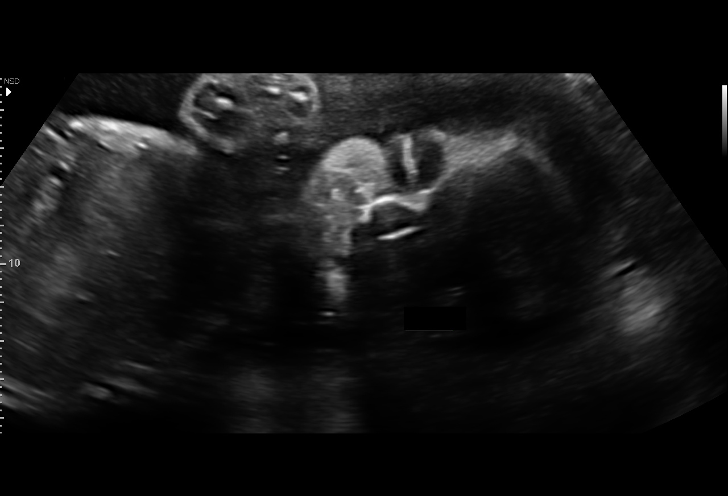
[im 32/32]
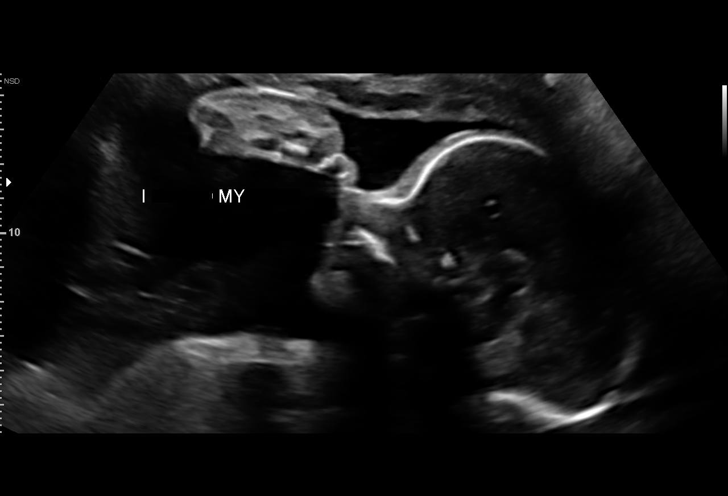

[14 of 28 positions shown; findings below may reference images not displayed]

----------------------------------------------------------------------

 ----------------------------------------------------------------------
Indications

  Hypertension - Chronic/Pre-existing
  26 weeks gestation of pregnancy
  Smoking complicating pregnancy, second
  trimester
  Obesity complicating pregnancy, second
  trimester
  Other abnormal finding on antenatal
  screening of mother (Insufficient fetal DNA,
  FF 1.1%) Declined LUCACI
 ----------------------------------------------------------------------
Fetal Evaluation

 Num Of Fetuses:          1
 Fetal Heart              149
 Rate(bpm):
 Cardiac Activity:        Observed
 Presentation:            Cephalic
 Placenta:                Posterior
 P. Cord Insertion:       Previously seen as normal

 Amniotic Fluid
 AFI FV:      Subjectively upper-normal

 AFI Sum(cm)     %Tile       Largest Pocket(cm)
 23.97           97

 RUQ(cm)       RLQ(cm)       LUQ(cm)        LLQ(cm)

Biometry
 BPD:      71.3  mm     G. Age:  28w 4d         95  %    CI:         75.35  %    70 - 86
                                                         FL/HC:       19.1  %    18.6 -
 HC:      260.5  mm     G. Age:  28w 2d         83  %    HC/AC:       1.10       1.04 -
 AC:      236.5  mm     G. Age:  28w 0d         84  %    FL/BPD:      69.8  %    71 - 87
 FL:       49.8  mm     G. Age:  26w 6d         48  %    FL/AC:       21.1  %    20 - 24

 LV:        6.4  mm

 Est. FW:    8850   g      2 lb 7 oz     76  %
                    m
OB History

 Gravidity:    1
Gestational Age

 LMP:           26w 3d        Date:  09/07/17                 EDD:    06/14/18
 U/S Today:     28w 0d                                        EDD:    06/03/18
 Best:          26w 3d     Det. By:  LMP  (09/07/17)          EDD:    06/14/18
Anatomy

 Cranium:               Appears normal         LVOT:                   Previously seen
 Cavum:                 Previously seen        Aortic Arch:            Previously seen
 Ventricles:            Appears normal         Ductal Arch:            Previously seen
 Choroid Plexus:        Previously seen        Diaphragm:              Appears normal
 Cerebellum:            Previously seen        Stomach:                Appears normal,
                                                                       left sided
 Posterior Fossa:       Previously seen        Abdomen:                Appears normal
 Nuchal Fold:           Previously seen        Abdominal Wall:         Previously seen
 Face:                  Orbits and profile     Cord Vessels:           Previously seen
                        previously seen
 Lips:                  Previously seen        Kidneys:                Appear normal
 Palate:                Previously seen        Bladder:                Appears normal
 Thoracic:              Appears normal         Spine:                  Previously seen
 Heart:                 Appears normal         Upper Extremities:      Previously seen
                        (4CH, axis, and
                        situs
 RVOT:                  Previously seen        Lower Extremities:      Previously seen

 Other:  Male gender previously seen. Right heel previously visualized.
         Lenses previously visualized.
Cervix Uterus Adnexa

 Cervix
 Length:              3  cm.
 Normal appearance by transabdominal scan.

 Left Ovary
 Previously seen.

 Right Ovary
 Previously seen
Impression

 Normal interval growth.
Recommendations

 Follow up growth in 4-6 weeks
 Continue serial growth and initiate weekly testing at 32
 weeks given diagnosis of CHTN

## 2020-12-01 ENCOUNTER — Other Ambulatory Visit: Payer: Self-pay | Admitting: Obstetrics and Gynecology

## 2021-01-03 ENCOUNTER — Other Ambulatory Visit: Payer: Self-pay | Admitting: Obstetrics and Gynecology

## 2021-01-08 ENCOUNTER — Other Ambulatory Visit: Payer: Self-pay

## 2021-01-08 DIAGNOSIS — F32A Depression, unspecified: Secondary | ICD-10-CM

## 2021-01-08 DIAGNOSIS — F419 Anxiety disorder, unspecified: Secondary | ICD-10-CM

## 2021-01-08 MED ORDER — CITALOPRAM HYDROBROMIDE 20 MG PO TABS: 20.0000 mg | ORAL_TABLET | Freq: Every day | ORAL | 0 refills | Status: DC

## 2021-01-08 NOTE — Progress Notes (Signed)
Rx sent for Citalopram per ok Dr.Pickens

## 2021-01-15 ENCOUNTER — Other Ambulatory Visit: Payer: Self-pay

## 2021-01-15 NOTE — Progress Notes (Signed)
Rx refill for birth control faxed back to pt pharmacy approved w/ refills  Confirmation received.

## 2021-03-12 ENCOUNTER — Telehealth: Payer: BC Managed Care – PPO | Admitting: Nurse Practitioner

## 2021-03-12 DIAGNOSIS — G4452 New daily persistent headache (NDPH): Secondary | ICD-10-CM

## 2021-03-12 NOTE — Progress Notes (Signed)
Virtual Visit Consent   Shirita Esson, you are scheduled for a virtual visit with a [place] provider today.     Just as with appointments in the office, your consent must be obtained to participate.  Your consent will be active for this visit and any virtual visit you may have with one of our providers in the next 365 days.     If you have a MyChart account, a copy of this consent can be sent to you electronically.  All virtual visits are billed to your insurance company just like a traditional visit in the office.    As this is a virtual visit, video technology does not allow for your provider to perform a traditional examination.  This may limit your provider's ability to fully assess your condition.  If your provider identifies any concerns that need to be evaluated in person or the need to arrange testing (such as labs, EKG, etc.), we will make arrangements to do so.     Although advances in technology are sophisticated, we cannot ensure that it will always work on either your end or our end.  If the connection with a video visit is poor, the visit may have to be switched to a telephone visit.  With either a video or telephone visit, we are not always able to ensure that we have a secure connection.     I need to obtain your verbal consent now.   Are you willing to proceed with your visit today?    Eimile Sachs has provided verbal consent on 03/12/2021 for a virtual visit (video or telephone).   Apolonio Schneiders, FNP   Date: 03/12/2021 1:11 PM   Virtual Visit via Video Note   I, Apolonio Schneiders, connected with  Tannis Dyment  (MD:[id number], [birth year]) on 03/12/21 at  1:30 PM EST by a video-enabled telemedicine application and verified that I am speaking with the correct person using two identifiers.  Location: Patient: Virtual Visit Location Patient: Home Provider: Virtual Visit Location Provider: Home Office   I discussed the limitations of evaluation and management by telemedicine and the  availability of in person appointments. The patient expressed understanding and agreed to proceed.    History of Present Illness: Tami Swanson is a 31 y.o. who identifies as a female who was assigned female at birth, and is being seen today with complaints of pain behind her left ear.   She works at [employer] and notices when she is busy at work she feels "off balance" like she leans to the left. She has had headaches that radiate up the back of her head over the past month  She has pain and tenderness behind her left ear that started 2 weeks ago and today is the worst it has been.   She feels as though her eye is watering on the left and that the left side of her face feels "different" but not numb.   Denies a history of migraines   Denies any recent travel  Denies recent URI or nasal congestion   She works two jobs, she is full time in a dermatology office and part time at [employer]   Problems:  Patient Active Problem List   Diagnosis Date Noted   Anxiety and depression 10/07/2019   Hypertension    Obesity (BMI 30-39.9) 12/04/2017   Condyloma acuminata of vulva in pregnancy, unspecified trimester 12/03/2017   History of genital warts 12/03/2017   Hirsutism 12/03/2017   Tobacco dependence syndrome 06/19/2017  Allergies: No Known Allergies Medications:  Current Outpatient Medications:    citalopram (CELEXA) 20 MG tablet, Take 1 tablet (20 mg total) by mouth daily. Take in the morning, Disp: 90 tablet, Rfl: 0   cyclobenzaprine (FLEXERIL) 10 MG tablet, Take 1 tablet (10 mg total) by mouth every 8 (eight) hours as needed for muscle spasms. (Patient not taking: Reported on 06/02/2018), Disp: 30 tablet, Rfl: 1   Elastic Bandages & Supports (COMFORT FIT MATERNITY SUPP LG) MISC, Wear daily when ambulating, Disp: 1 each, Rfl: 0   norethindrone (MICRONOR) 0.35 MG tablet, Take 1 tablet (0.35 mg total) by mouth daily., Disp: 84 tablet, Rfl: 3   oxyCODONE (OXY IR/ROXICODONE) 5 MG  immediate release tablet, Take 1 tablet (5 mg total) by mouth every 6 (six) hours as needed for moderate pain or breakthrough pain., Disp: 20 tablet, Rfl: 0   Prenatal Vit-Fe Fumarate-FA (PRENATAL VITAMIN PO), Take by mouth., Disp: , Rfl:   Observations/Objective: Patient is well-developed, well-nourished in no acute distress.  Resting comfortably in her car Head is normocephalic, atraumatic.  No labored breathing.  Speech is clear and coherent with logical content.  Patient is alert and oriented at baseline.  Pupils are equal, EOMI  Face symmetrical  Smile symmetrical  Tongue midline  Can close each eye - one at at time   Assessment and Plan: 1. New daily persistent headache Based on symptoms patients is advised to be seen in person today. Given the address of local UC that is close to her work and she is agreeable to be seen today.   Explained to patient that these symptoms warrant in person evaluation for full neurological exam and ear assessment. She is agreeable and appropriate in no acute distress during time of appointment.   Follow Up Instructions: I discussed the assessment and treatment plan with the patient. The patient was provided an opportunity to ask questions and all were answered. The patient agreed with the plan and demonstrated an understanding of the instructions.  A copy of instructions were sent to the patient via MyChart unless otherwise noted below.    The patient was advised to call back or seek an in-person evaluation if the symptoms worsen or if the condition fails to improve as anticipated.  Time:  I spent 15 minutes with the patient via telehealth technology discussing the above problems/concerns.    Apolonio Schneiders, FNP

## 2021-03-14 ENCOUNTER — Other Ambulatory Visit: Payer: Self-pay

## 2021-03-14 ENCOUNTER — Ambulatory Visit (HOSPITAL_COMMUNITY)
Admission: RE | Admit: 2021-03-14 | Discharge: 2021-03-14 | Disposition: A | Discharge status: Home / Self Care | Payer: BC Managed Care – PPO | Source: Ambulatory Visit | Attending: Family Medicine | Admitting: Family Medicine

## 2021-03-14 ENCOUNTER — Encounter (HOSPITAL_COMMUNITY): Payer: Self-pay

## 2021-03-14 VITALS — BP 125/79 | HR 88 | Temp 98.3°F | Resp 18

## 2021-03-14 DIAGNOSIS — M542 Cervicalgia: Secondary | ICD-10-CM

## 2021-03-14 MED ORDER — PREDNISONE 20 MG PO TABS: 40.0000 mg | ORAL_TABLET | Freq: Every day | ORAL | 0 refills | Status: DC

## 2021-03-14 MED ORDER — HYDROCODONE-ACETAMINOPHEN 5-325 MG PO TABS: 1.0000 | ORAL_TABLET | Freq: Four times a day (QID) | ORAL | 0 refills | Status: DC | PRN

## 2021-03-14 NOTE — ED Provider Notes (Addendum)
[facility]   [id number] 03/14/21 Arrival Time: 1241  ASSESSMENT & PLAN:  1. Neck pain on left side    Unclear etiology/prognosis. Afebrile. No signs of head/neck infection. Trial of: Meds ordered this encounter  Medications   predniSONE (DELTASONE) 20 MG tablet    Sig: Take 2 tablets (40 mg total) by mouth daily.    Dispense:  10 tablet    Refill:  0   HYDROcodone-acetaminophen (NORCO/VICODIN) 5-325 MG tablet    Sig: Take 1 tablet by mouth every 6 (six) hours as needed for moderate pain or severe pain.    Dispense:  8 tablet    Refill:  0     Discharge Instructions      Be aware, you have been prescribed pain medications that may cause drowsiness. While taking this medication, do not take any other medications containing acetaminophen (Tylenol). Do not combine with alcohol or other illicit drugs. Please do not drive, operate heavy machinery, or take part in activities that require making important decisions while on this medication as your judgement may be clouded.     OTC symptom care as needed. Recommend:  Follow-up Information     [facility].   Why: If worsening or failing to improve as anticipated. Contact information: [address] [place] [address] [phone]         [facility] EMERGENCY DEPARTMENT.   Specialty: Emergency Medicine Why: If symptoms significantly worsen in any way. Contact information: [address] [id number] mc [place] [address] [phone]                Reviewed expectations re: course of current medical issues. Questions answered. Outlined signs and symptoms indicating need for more acute intervention. Patient verbalized understanding. After Visit Summary given.   SUBJECTIVE: History from: patient.  Tami Swanson is a 31 y.o. female who presents with complaint of intermittent L sided neck pain. Desc as stabbing.  Radiates to L ear. No hearing changes/loss. Non-traumatic. No specific aggravating or alleviating factors reported. Random episodes. OTC analgesics without much relief. Does affect sleep. Prob started about a month ago.  Social History   Tobacco Use  Smoking Status Former   Packs/day: 0.25   Types: Cigarettes  Smokeless Tobacco Former  Tobacco Comments   Only smokes one cigarette a day    OBJECTIVE:  Vitals:   03/14/21 1320  BP: 125/79  Pulse: 88  Resp: 18  Temp: 98.3 F (36.8 C)  TempSrc: Oral  SpO2: 97%     General appearance: alert; appears fatigued Ear Canal: normal TM: bilateral: normal Neck: supple without LAD; no midline TTP; some soreness over L neck musculature; no swelling/masses appreciated Lungs: unlabored respirations, symmetrical air entry; cough: absent; no respiratory distress Skin: warm and dry Psychological: alert and cooperative; normal mood and affect  No Known Allergies  Past Medical History:  Diagnosis Date   Hypertension    Family History  Problem Relation Age of Onset   Hypertension Father    Heart disease Maternal Grandfather    Social History   Socioeconomic History   Marital status: Single    Spouse name: Not on file   Number of children: Not on file   Years of education: Not on file   Highest education level: Not on file  Occupational History   Occupation: Quality Assurance  Tobacco Use   Smoking status: Former    Packs/day: 0.25    Types: Cigarettes  Smokeless tobacco: Former   Tobacco comments:    Only smokes one cigarette a day  Vaping Use   Vaping Use: Never used  Substance and Sexual Activity   Alcohol use: Never   Drug use: No   Sexual activity: Yes    Birth control/protection: Pill  Other Topics Concern   Not on file  Social History Narrative   Nursing student at [employer]   Social Determinants of Health   Financial Resource Strain: Not on file  Food Insecurity: Not on file  Transportation Needs: Not  on file  Physical Activity: Not on file  Stress: Not on file  Social Connections: Not on file  Intimate Partner Violence: Not on file             [place], MD 03/15/21 1225    Mardella Layman, MD 03/15/21 1226

## 2021-03-14 NOTE — ED Triage Notes (Signed)
Pt had left ear pain that has been over a month. Did virtual on Monday who recommended her being seen.

## 2021-03-14 NOTE — Discharge Instructions (Signed)

## 2021-05-06 ENCOUNTER — Other Ambulatory Visit: Payer: Self-pay | Admitting: Obstetrics and Gynecology

## 2021-05-06 DIAGNOSIS — F32A Depression, unspecified: Secondary | ICD-10-CM

## 2021-05-06 DIAGNOSIS — F419 Anxiety disorder, unspecified: Secondary | ICD-10-CM

## 2021-05-09 MED ORDER — CITALOPRAM HYDROBROMIDE 20 MG PO TABS: 20.0000 mg | ORAL_TABLET | Freq: Every day | ORAL | 0 refills | Status: DC

## 2021-05-14 ENCOUNTER — Telehealth: Payer: BC Managed Care – PPO | Admitting: Emergency Medicine

## 2021-05-14 DIAGNOSIS — U071 COVID-19: Secondary | ICD-10-CM

## 2021-05-14 NOTE — Patient Instructions (Signed)
?  Tami Swanson, thank you for joining Tami Parsons, NP for today's virtual visit.  While this provider is not your primary care provider (PCP), if your PCP is located in our provider database this encounter information will be shared with them immediately following your visit. ? ?Consent: ?(Patient) Tami Swanson provided verbal consent for this virtual visit at the beginning of the encounter. ? ?Current Medications: ? ?Current Outpatient Medications:  ?  citalopram (CELEXA) 20 MG tablet, Take 1 tablet (20 mg total) by mouth daily. Take in the morning, Disp: 90 tablet, Rfl: 0 ?  Elastic Bandages & Supports (COMFORT FIT MATERNITY SUPP LG) MISC, Wear daily when ambulating, Disp: 1 each, Rfl: 0 ?  HYDROcodone-acetaminophen (NORCO/VICODIN) 5-325 MG tablet, Take 1 tablet by mouth every 6 (six) hours as needed for moderate pain or severe pain., Disp: 8 tablet, Rfl: 0 ?  norethindrone (MICRONOR) 0.35 MG tablet, Take 1 tablet (0.35 mg total) by mouth daily., Disp: 84 tablet, Rfl: 3 ?  predniSONE (DELTASONE) 20 MG tablet, Take 2 tablets (40 mg total) by mouth daily., Disp: 10 tablet, Rfl: 0 ?  Prenatal Vit-Fe Fumarate-FA (PRENATAL VITAMIN PO), Take by mouth., Disp: , Rfl:  ?  spironolactone (ALDACTONE) 100 MG tablet, Take 100 mg by mouth daily., Disp: , Rfl:   ? ?Medications ordered in this encounter:  ?No orders of the defined types were placed in this encounter. ?  ? ?*If you need refills on other medications prior to your next appointment, please contact your pharmacy* ? ?Follow-Up: ?Call back or seek an in-person evaluation if the symptoms worsen or if the condition fails to improve as anticipated. ? ?Other Instructions ?Continue using theraflu or nyquil to help manage your symptoms. I expect you will be sick for a few more days. Use ibuprofen for body aches and headache. Drink lots of liquids to stay hydrated and get plenty of rest. ?Start a saline nasal rinse to flush out your nasal passages. ?You can use plain Mucinex  to help thin congestion. ? ?You were to quarantine for 5 days from onset of your symptoms.  After day 5, if you have had no fever and you are feeling better, you can end quarantine but need to mask for an additional 5 days. ?After day 5 if you have a fever or are having significant symptoms, please quarantine for full 10 days. ? ?If you note any worsening of symptoms, any significant shortness of breath or any chest pain, please seek ER evaluation ASAP.  Please do not delay care! ? ? ?If you have been instructed to have an in-person evaluation today at a local Urgent Care facility, please use the link below. It will take you to a list of all of our available [facility], including address, phone number and hours of operation. Please do not delay care.  ?[facility] ? ?If you or a family member do not have a primary care provider, use the link below to schedule a visit and establish care. When you choose a [facility] primary care physician or advanced practice provider, you gain a long-term partner in health. ?Find a Primary Care Provider ? ?Learn more about [facility]'s in-office and virtual care options: ?[facility] - Get Care Now  ?

## 2021-05-14 NOTE — Progress Notes (Signed)
?Virtual Visit Consent  ? ?Tami Swanson, you are scheduled for a virtual visit with a [facility] provider today.   ?  ?Just as with appointments in the office, your consent must be obtained to participate.  Your consent will be active for this visit and any virtual visit you may have with one of our providers in the next 365 days.   ?  ?If you have a MyChart account, a copy of this consent can be sent to you electronically.  All virtual visits are billed to your insurance company just like a traditional visit in the office.   ? ?As this is a virtual visit, video technology does not allow for your provider to perform a traditional examination.  This may limit your provider's ability to fully assess your condition.  If your provider identifies any concerns that need to be evaluated in person or the need to arrange testing (such as labs, EKG, etc.), we will make arrangements to do so.   ?  ?Although advances in technology are sophisticated, we cannot ensure that it will always work on either your end or our end.  If the connection with a video visit is poor, the visit may have to be switched to a telephone visit.  With either a video or telephone visit, we are not always able to ensure that we have a secure connection.    ? ?I need to obtain your verbal consent now.   Are you willing to proceed with your visit today?  ?  ?Tami Swanson has provided verbal consent on 05/14/2021 for a virtual visit (video or telephone). ?  ?Cathlyn Parsons, NP  ? ?Date: 05/14/2021 10:59 AM ? ? ?Virtual Visit via Video Note  ? ?Tami Swanson, connected with  Tami Swanson  ([id number], 1990-09-17) on 05/14/21 at 10:45 AM EDT by a video-enabled telemedicine application and verified that I am speaking with the correct person using two identifiers. ? ?Location: ?Patient: Virtual Visit Location Patient: Home ?Provider: Virtual Visit Location Provider: Home Office ?  ?I discussed the limitations of evaluation and management by telemedicine and  the availability of in person appointments. The patient expressed understanding and agreed to proceed.   ? ?History of Present Illness: ?Tami Swanson is a 31 y.o. who identifies as a female who was assigned female at birth, and is being seen today for covid. Sx began 05/12/21 when she woke up, tested positive this morning. C/o HA, low grade fever (99.16F), body aches, chest and nasal congestion, cough and sneezing. Has been using theraflu or nyquil for temp relief of symptoms. Has a toddler at home so she is wearing a mask to try to prevent transmission to child. Denies SOB.  ? ?HPI: HPI  ?Problems:  ?Patient Active Problem List  ? Diagnosis Date Noted  ? Anxiety and depression 10/07/2019  ? Hypertension   ? Obesity (BMI 30-39.9) 12/04/2017  ? Condyloma acuminata of vulva in pregnancy, unspecified trimester 12/03/2017  ? History of genital warts 12/03/2017  ? Hirsutism 12/03/2017  ? Tobacco dependence syndrome 06/19/2017  ?  ?Allergies: No Known Allergies ?Medications:  ?Current Outpatient Medications:  ?  citalopram (CELEXA) 20 MG tablet, Take 1 tablet (20 mg total) by mouth daily. Take in the morning, Disp: 90 tablet, Rfl: 0 ?  Elastic Bandages & Supports (COMFORT FIT MATERNITY SUPP LG) MISC, Wear daily when ambulating, Disp: 1 each, Rfl: 0 ?  HYDROcodone-acetaminophen (NORCO/VICODIN) 5-325 MG tablet, Take 1 tablet by mouth every 6 (six) hours  as needed for moderate pain or severe pain., Disp: 8 tablet, Rfl: 0 ?  norethindrone (MICRONOR) 0.35 MG tablet, Take 1 tablet (0.35 mg total) by mouth daily., Disp: 84 tablet, Rfl: 3 ?  predniSONE (DELTASONE) 20 MG tablet, Take 2 tablets (40 mg total) by mouth daily., Disp: 10 tablet, Rfl: 0 ?  Prenatal Vit-Fe Fumarate-FA (PRENATAL VITAMIN PO), Take by mouth., Disp: , Rfl:  ?  spironolactone (ALDACTONE) 100 MG tablet, Take 100 mg by mouth daily., Disp: , Rfl:  ? ?Observations/Objective: ?Patient is well-developed, well-nourished in no acute distress.  ?Resting comfortably  at  home.  ?Head is normocephalic, atraumatic.  ?No labored breathing.  ?Speech is clear and coherent with logical content.  ?Patient is alert and oriented at baseline.  ?Pt sounds congested.  ? ?Assessment and Plan: ?1. COVID ? ?Reviewed supportive care measures and reasons for seeking further care.  ? ?Follow Up Instructions: ?I discussed the assessment and treatment plan with the patient. The patient was provided an opportunity to ask questions and all were answered. The patient agreed with the plan and demonstrated an understanding of the instructions.  A copy of instructions were sent to the patient via MyChart unless otherwise noted below.  ? ?The patient was advised to call back or seek an in-person evaluation if the symptoms worsen or if the condition fails to improve as anticipated. ? ?Time:  ?I spent 10 minutes with the patient via telehealth technology discussing the above problems/concerns.   ? ?Cathlyn Parsons, NP ?

## 2021-06-13 ENCOUNTER — Encounter: Payer: Self-pay | Admitting: Obstetrics and Gynecology

## 2021-06-13 ENCOUNTER — Other Ambulatory Visit (HOSPITAL_COMMUNITY)
Admission: RE | Admit: 2021-06-13 | Discharge: 2021-06-13 | Disposition: A | Discharge status: Home / Self Care | Payer: BC Managed Care – PPO | Source: Ambulatory Visit | Attending: Obstetrics and Gynecology | Admitting: Obstetrics and Gynecology

## 2021-06-13 ENCOUNTER — Ambulatory Visit (INDEPENDENT_AMBULATORY_CARE_PROVIDER_SITE_OTHER): Payer: BC Managed Care – PPO | Admitting: Obstetrics and Gynecology

## 2021-06-13 VITALS — BP 130/86 | HR 90 | Ht 67.0 in | Wt 266.0 lb

## 2021-06-13 DIAGNOSIS — Z124 Encounter for screening for malignant neoplasm of cervix: Secondary | ICD-10-CM | POA: Insufficient documentation

## 2021-06-13 DIAGNOSIS — Z01419 Encounter for gynecological examination (general) (routine) without abnormal findings: Secondary | ICD-10-CM | POA: Insufficient documentation

## 2021-06-13 DIAGNOSIS — Z6841 Body Mass Index (BMI) 40.0 and over, adult: Secondary | ICD-10-CM | POA: Diagnosis not present

## 2021-06-13 DIAGNOSIS — F419 Anxiety disorder, unspecified: Secondary | ICD-10-CM

## 2021-06-13 DIAGNOSIS — B3731 Acute candidiasis of vulva and vagina: Secondary | ICD-10-CM | POA: Diagnosis not present

## 2021-06-13 DIAGNOSIS — E669 Obesity, unspecified: Secondary | ICD-10-CM

## 2021-06-13 DIAGNOSIS — F32A Depression, unspecified: Secondary | ICD-10-CM

## 2021-06-13 NOTE — Progress Notes (Signed)
Patient presents for Annual Exam.  ? ?Taking Spironolactone for PCOS ? ?LMP: 06/03/21  last 2 cycles were just spotting. ? ? ?Last Pap:> 3ys ago at [facility]  ? ?CC: discuss weight gain, pt wants to discuss conceiving pt does not want a high-risk pregnancy.  ?  ? ?

## 2021-06-14 ENCOUNTER — Encounter: Payer: Self-pay | Admitting: Obstetrics and Gynecology

## 2021-06-14 ENCOUNTER — Other Ambulatory Visit: Payer: Self-pay | Admitting: *Deleted

## 2021-06-14 LAB — URINALYSIS, ROUTINE W REFLEX MICROSCOPIC
Bilirubin, UA: NEGATIVE
Glucose, UA: NEGATIVE
Ketones, UA: NEGATIVE
Leukocytes,UA: NEGATIVE
Nitrite, UA: NEGATIVE
Protein,UA: NEGATIVE
RBC, UA: NEGATIVE
Specific Gravity, UA: 1.009 (ref 1.005–1.030)
Urobilinogen, Ur: 0.2 mg/dL (ref 0.2–1.0)
pH, UA: 6 (ref 5.0–7.5)

## 2021-06-14 LAB — COMPREHENSIVE METABOLIC PANEL
ALT: 23 IU/L (ref 0–32)
AST: 20 IU/L (ref 0–40)
Albumin/Globulin Ratio: 2.1 (ref 1.2–2.2)
Albumin: 4.6 g/dL (ref 3.8–4.8)
Alkaline Phosphatase: 88 IU/L (ref 44–121)
BUN/Creatinine Ratio: 11 (ref 9–23)
BUN: 11 mg/dL (ref 6–20)
Bilirubin Total: 0.2 mg/dL (ref 0.0–1.2)
CO2: 22 mmol/L (ref 20–29)
Calcium: 9.3 mg/dL (ref 8.7–10.2)
Chloride: 102 mmol/L (ref 96–106)
Creatinine, Ser: 0.96 mg/dL (ref 0.57–1.00)
Globulin, Total: 2.2 g/dL (ref 1.5–4.5)
Glucose: 86 mg/dL (ref 70–99)
Potassium: 4.2 mmol/L (ref 3.5–5.2)
Sodium: 139 mmol/L (ref 134–144)
Total Protein: 6.8 g/dL (ref 6.0–8.5)
eGFR: 81 mL/min/{1.73_m2} (ref >59)

## 2021-06-14 LAB — TSH: TSH: 1.74 u[IU]/mL (ref 0.450–4.500)

## 2021-06-14 LAB — CBC
Hematocrit: 39.3 % (ref 34.0–46.6)
Hemoglobin: 13.5 g/dL (ref 11.1–15.9)
MCH: 29.4 pg (ref 26.6–33.0)
MCHC: 34.4 g/dL (ref 31.5–35.7)
MCV: 86 fL (ref 79–97)
Platelets: 328 10*3/uL (ref 150–450)
RBC: 4.59 x10E6/uL (ref 3.77–5.28)
RDW: 12.9 % (ref 11.7–15.4)
WBC: 8.5 10*3/uL (ref 3.4–10.8)

## 2021-06-14 LAB — LIPID PANEL
Chol/HDL Ratio: 3.6 ratio (ref 0.0–4.4)
Cholesterol, Total: 155 mg/dL (ref 100–199)
HDL: 43 mg/dL (ref >39)
LDL Chol Calc (NIH): 85 mg/dL (ref 0–99)
Triglycerides: 154 mg/dL — ABNORMAL HIGH (ref 0–149)
VLDL Cholesterol Cal: 27 mg/dL (ref 5–40)

## 2021-06-14 LAB — HEMOGLOBIN A1C
Est. average glucose Bld gHb Est-mCnc: 108 mg/dL
Hgb A1c MFr Bld: 5.4 % (ref 4.8–5.6)

## 2021-06-14 MED ORDER — FLUCONAZOLE 150 MG PO TABS
150.0000 mg | ORAL_TABLET | Freq: Once | ORAL | 3 refills | Status: AC
Start: 2021-06-14 — End: 2021-06-14

## 2021-06-15 LAB — CERVICOVAGINAL ANCILLARY ONLY
Bacterial Vaginitis (gardnerella): POSITIVE — AB
Candida Glabrata: NEGATIVE
Candida Vaginitis: NEGATIVE
Chlamydia: NEGATIVE
Comment: NEGATIVE
Comment: NEGATIVE
Comment: NEGATIVE
Comment: NEGATIVE
Comment: NEGATIVE
Comment: NORMAL
Neisseria Gonorrhea: NEGATIVE
Trichomonas: NEGATIVE

## 2021-06-15 NOTE — Progress Notes (Signed)
Obstetrics and Gynecology ?Annual Patient Evaluation ? ?Appointment Date: 06/13/2021 ? ?OB[facility]N Clinic: [facility] ? ?Primary Care Provider: None ? ?Referring Provider: No ref. provider found ? ?Chief Complaint:  ?Chief Complaint  ?Patient presents with  ? Gynecologic Exam  ? ? ?History of Present Illness: Tami Swanson is a 31 y.o. G1P1001 (Patient's last menstrual period was 06/03/2021 (exact date).), seen for the above chief complaint. Her past medical history is significant for PCOS, BMI 40s, HTN ? ? ? ?Review of Systems: Pertinent items noted in HPI and remainder of comprehensive ROS otherwise negative.  ? ?Patient Active Problem List  ? Diagnosis Date Noted  ? Anxiety and depression 10/07/2019  ? Hypertension   ? Obesity (BMI 30-39.9) 12/04/2017  ? Condyloma acuminata of vulva in pregnancy, unspecified trimester 12/03/2017  ? History of genital warts 12/03/2017  ? Hirsutism 12/03/2017  ? Tobacco dependence syndrome 06/19/2017  ? ?Past Medical History:  ?Past Medical History:  ?Diagnosis Date  ? Hypertension   ? PCOS (polycystic ovarian syndrome)   ? ? ?Past Surgical History:  ?Past Surgical History:  ?Procedure Laterality Date  ? CESAREAN SECTION N/A 06/10/2018  ? Procedure: CESAREAN SECTION;  Surgeon: Tami Edinger, MD;  Location: [facility] LD ORS;  Service: Obstetrics;  Laterality: N/A;  ? FOOT SURGERY Right 2006  ? ? ?Past Obstetrical History:  ?OB History  ?Gravida Para Term Preterm AB Living  ?1 1 1     1   ?SAB IAB Ectopic Multiple Live Births  ?      0 1  ?  ?# Outcome Date GA Lbr Len/2nd Weight Sex Delivery Anes PTL Lv  ?1 Term 06/10/18 [redacted]w[redacted]d 05:06 / 08:52 8 lb 1.3 oz (3.665 kg) M CS-LTranv EPI  LIV  ? ?Past Gynecological History: As per HPI. ?Periods: regular, qmonth ?History of Pap Smear(s): Yes.   Last pap 2019, which was wnl ?She is currently using oral progesterone-only contraceptive for contraception.  ? ?Social History:  ?Social History  ? ?Socioeconomic History  ? Marital  status: Single  ?  Spouse name: Not on file  ? Number of children: Not on file  ? Years of education: Not on file  ? Highest education level: Not on file  ?Occupational History  ? Occupation: Art therapist  ?Tobacco Use  ? Smoking status: Former  ?  Packs/day: 0.25  ?  Types: Cigarettes  ? Smokeless tobacco: Former  ? Tobacco comments:  ?  Only smokes one cigarette a day  ?Vaping Use  ? Vaping Use: Every day  ?Substance and Sexual Activity  ? Alcohol use: Never  ? Drug use: No  ? Sexual activity: Yes  ?  Partners: Male  ?  Birth control/protection: Pill  ?Other Topics Concern  ? Not on file  ?Social History Narrative  ? Nursing student at [employer]  ? ?Social Determinants of Health  ? ?Financial Resource Strain: Not on file  ?Food Insecurity: Not on file  ?Transportation Needs: Not on file  ?Physical Activity: Not on file  ?Stress: Not on file  ?Social Connections: Not on file  ?Intimate Partner Violence: Not on file  ? ? ?Family History:  ?Family History  ?Problem Relation Age of Onset  ? Hypertension Father   ? Heart disease Maternal Grandfather   ? ? ?Medications ?Tami Swanson had no medications administered during this visit. ?Current Outpatient Medications  ?Medication Sig Dispense Refill  ? citalopram (CELEXA) 20 MG tablet Take 1 tablet (20 mg total) by mouth  daily. Take in the morning 90 tablet 0  ? norethindrone (MICRONOR) 0.35 MG tablet Take 1 tablet (0.35 mg total) by mouth daily. 84 tablet 3  ? spironolactone (ALDACTONE) 100 MG tablet Take 100 mg by mouth daily.    ? Elastic Bandages & Supports (COMFORT FIT MATERNITY SUPP LG) MISC Wear daily when ambulating 1 each 0  ? HYDROcodone-acetaminophen (NORCO/VICODIN) 5-325 MG tablet Take 1 tablet by mouth every 6 (six) hours as needed for moderate pain or severe pain. 8 tablet 0  ? predniSONE (DELTASONE) 20 MG tablet Take 2 tablets (40 mg total) by mouth daily. 10 tablet 0  ? Prenatal Vit-Fe Fumarate-FA (PRENATAL VITAMIN PO) Take by mouth.    ? ?No current  facility-administered medications for this visit.  ? ? ?Allergies ?Patient has no known allergies. ? ? ?Physical Exam:  ?BP 130/86   Pulse 90   Ht 5\' 7"  (1.702 m)   Wt 266 lb (120.7 kg)   LMP 06/03/2021 (Exact Date)   BMI 41.66 kg/m?  Body mass index is 41.66 kg/m?. ? ?General appearance: Well nourished, well developed female in no acute distress.  ?Neck:  Supple, normal appearance, and no thyromegaly  ?Cardiovascular: normal s1 and s2.  No murmurs, rubs or gallops. ?Respiratory:  Clear to auscultation bilateral. Normal respiratory effort ?Abdomen: positive bowel sounds and no masses, hernias; diffusely non tender to palpation, non distended ?Breasts: breasts appear normal, no suspicious masses, no skin or nipple changes or axillary nodes, and normal palpation. ?Neuro/Psych:  Normal mood and affect.  ?Skin:  Warm and dry.  ?Lymphatic:  No inguinal lymphadenopathy.  ? ?Pelvic exam: is limited by body habitus ?EGBUS: within normal limits ?Vagina: within normal limits and with no blood or discharge in the vault ?Cervix: normal appearing cervix without tenderness, discharge or lesions. ?Uterus:  nonenlarged and non tender ?Adnexa:  normal adnexa and no mass, fullness, tenderness ?Rectovaginal: deferred ? ?Laboratory: none ? ?Radiology: none ? ?Assessment: pt doing well ? ?Plan:  ?1. Well woman exam ?Encouraged and resources for finding a PCP ?Patient interested in potentially trying for another child. I d/w her re: trying to decrease co-morbidities (establish with pcp, weight loss [try to at least get to post pregnancy weight which is 30lbs less], etc) to decrease pregnancy risks. I also told her that if she got pregnant being on spironolactone has unknown risks with pregnancy but no overt teratogenic risks; I d/w her re: switching to [facility] which is a POP and also has anti-androgen effects, too.  ?- Cytology - PAP ?- Urinalysis, Routine w reflex microscopic ?- CBC ?- Comprehensive metabolic panel ?- Hemoglobin  A1c ?- TSH ?- Cervicovaginal ancillary only( [name]) ?- Lipid panel ? ?2. BMI 40.0-44.9, adult ([place]) ?Weight loss strategies d/w and offered nutrition referral if she is interested. 10 minutes ?- CBC ?- Comprehensive metabolic panel ?- Hemoglobin A1c ?- TSH ?- Lipid panel ? ?3. Vulvovaginal candidiasis ?- Cervicovaginal ancillary only( [place]) ? ?4. Cervical cancer screening ?- Cytology - PAP ? ?5. Anxiety/depression ?Stable on Celexa. Tried to self d/c but had withdrawal and worsened s/s after a few days. I told her that I recommend staying on celexa (category C) given her s/s but if she wants to come off, she will need a slow taper over several months given how long she has been on ? ? ?RTC PRN ? ?Durene Romans MD ?Attending ?[facility]) ? ?

## 2021-06-17 MED ORDER — METRONIDAZOLE 500 MG PO TABS: 500.0000 mg | ORAL_TABLET | Freq: Two times a day (BID) | ORAL | 0 refills | Status: DC

## 2021-06-17 NOTE — Addendum Note (Signed)
Addended by: Aletha Halim on: 06/17/2021 02:03 PM ? ? Modules accepted: Orders ? ?

## 2021-06-18 LAB — CYTOLOGY - PAP
Comment: NEGATIVE
Diagnosis: NEGATIVE
High risk HPV: NEGATIVE

## 2021-06-19 ENCOUNTER — Encounter: Payer: Self-pay | Admitting: Obstetrics and Gynecology

## 2021-06-28 ENCOUNTER — Encounter: Payer: Self-pay | Admitting: Obstetrics and Gynecology

## 2021-07-30 ENCOUNTER — Other Ambulatory Visit: Payer: Self-pay | Admitting: Obstetrics and Gynecology

## 2021-07-30 DIAGNOSIS — F32A Depression, unspecified: Secondary | ICD-10-CM

## 2021-08-15 ENCOUNTER — Other Ambulatory Visit: Payer: Self-pay | Admitting: Obstetrics and Gynecology

## 2021-08-15 DIAGNOSIS — F419 Anxiety disorder, unspecified: Secondary | ICD-10-CM

## 2021-08-15 MED ORDER — CITALOPRAM HYDROBROMIDE 20 MG PO TABS: 20.0000 mg | ORAL_TABLET | Freq: Every day | ORAL | 0 refills | Status: DC

## 2021-08-29 ENCOUNTER — Ambulatory Visit (INDEPENDENT_AMBULATORY_CARE_PROVIDER_SITE_OTHER): Payer: BC Managed Care – PPO | Admitting: *Deleted

## 2021-08-29 ENCOUNTER — Other Ambulatory Visit (HOSPITAL_COMMUNITY)
Admission: RE | Admit: 2021-08-29 | Discharge: 2021-08-29 | Disposition: A | Discharge status: Home / Self Care | Payer: BC Managed Care – PPO | Source: Ambulatory Visit | Attending: Family Medicine | Admitting: Family Medicine

## 2021-08-29 VITALS — BP 144/85

## 2021-08-29 DIAGNOSIS — N898 Other specified noninflammatory disorders of vagina: Secondary | ICD-10-CM | POA: Diagnosis not present

## 2021-08-29 DIAGNOSIS — B3731 Acute candidiasis of vulva and vagina: Secondary | ICD-10-CM | POA: Insufficient documentation

## 2021-08-29 DIAGNOSIS — N760 Acute vaginitis: Secondary | ICD-10-CM | POA: Insufficient documentation

## 2021-08-29 NOTE — Progress Notes (Cosign Needed Addendum)
SUBJECTIVE:  31 y.o. female complains of white vaginal discharge for few day(s). Denies abnormal vaginal bleeding or significant pelvic pain or fever. No UTI symptoms. Denies history of known exposure to STD.  No LMP recorded. (Menstrual status: Irregular Periods).  OBJECTIVE:  She appears well, afebrile. Urine dipstick: not done.  ASSESSMENT:  Vaginal Discharge     PLAN:  GC, chlamydia, trichomonas, BVAG, CVAG probe sent to lab. Treatment: To be determined once lab results are received ROV prn if symptoms persist or worsen.  [name], RN

## 2021-08-30 NOTE — Progress Notes (Addendum)
Patient seen and assessed by nursing staff.  Agree with documentation and plan. Pt. With BV and yeast--rx sent in.

## 2021-08-31 ENCOUNTER — Encounter: Payer: Self-pay | Admitting: Obstetrics and Gynecology

## 2021-08-31 LAB — CERVICOVAGINAL ANCILLARY ONLY
Bacterial Vaginitis (gardnerella): POSITIVE — AB
Candida Glabrata: NEGATIVE
Candida Vaginitis: POSITIVE — AB
Chlamydia: NEGATIVE
Comment: NEGATIVE
Comment: NEGATIVE
Comment: NEGATIVE
Comment: NEGATIVE
Comment: NEGATIVE
Comment: NORMAL
Neisseria Gonorrhea: NEGATIVE
Trichomonas: NEGATIVE

## 2021-08-31 MED ORDER — METRONIDAZOLE 500 MG PO TABS: 500.0000 mg | ORAL_TABLET | Freq: Two times a day (BID) | ORAL | 0 refills | Status: AC

## 2021-08-31 MED ORDER — FLUCONAZOLE 150 MG PO TABS: 150.0000 mg | ORAL_TABLET | Freq: Once | ORAL | 2 refills | Status: AC

## 2021-08-31 NOTE — Addendum Note (Signed)
Addended by: Reva Bores on: 08/31/2021 10:25 PM   Modules accepted: Orders

## 2021-11-09 ENCOUNTER — Telehealth: Payer: BC Managed Care – PPO | Admitting: Emergency Medicine

## 2021-11-09 DIAGNOSIS — R1084 Generalized abdominal pain: Secondary | ICD-10-CM

## 2021-11-09 NOTE — Progress Notes (Signed)
I am sorry you are having pain and worried about a ruptured cyst.  The triage nurse you messaged with misspoke - I cannot help you via evisit. You might be able to do a Virtual Urgent Care video visit instead, or you may need to be seen in person, such as at an urgent care. [facility] locations are listed below. If you prefer to try and see if a Virtual Urgent Care video visit will work for you, you can get a Virtual Urgent Care visit through MyChart or through the [facility] Virtual Care website: [email or web address]

## 2021-11-16 ENCOUNTER — Other Ambulatory Visit: Payer: Self-pay | Admitting: Obstetrics and Gynecology

## 2021-11-16 DIAGNOSIS — F419 Anxiety disorder, unspecified: Secondary | ICD-10-CM

## 2021-11-26 ENCOUNTER — Ambulatory Visit (INDEPENDENT_AMBULATORY_CARE_PROVIDER_SITE_OTHER): Payer: Self-pay | Admitting: Internal Medicine

## 2021-11-26 VITALS — BP 137/90 | HR 106 | Ht 66.0 in | Wt 268.8 lb

## 2021-11-26 DIAGNOSIS — Z0289 Encounter for other administrative examinations: Secondary | ICD-10-CM

## 2021-11-26 DIAGNOSIS — I10 Essential (primary) hypertension: Secondary | ICD-10-CM

## 2021-11-26 DIAGNOSIS — Z6841 Body Mass Index (BMI) 40.0 and over, adult: Secondary | ICD-10-CM

## 2021-11-26 NOTE — Progress Notes (Signed)
Office: [phone]  /  Fax: [phone]  Initial Visit  Tami Swanson was seen in clinic today to evaluate for obesity. She is interested in losing weight to improve overall health and reduce the risk of weight related complications.  She was referred by dermatologist.  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  She notes having difficulty with weight gain and maintaining a healthy weight since childhood.  She gained weight after having her son 3 years ago.  She skips breakfast and acknowledges eating convenient foods as she is tired at the end of her workday.  She is working 2 jobs.  She endorses poor endurance of feeling tired and fatigued some days.  Associated conditions hypertension and PCOS.  She is currently not taking blood pressure medications and has not been monitoring her blood pressure.  She is thinking about becoming pregnant and therefore has been avoiding medications in general.  She had tried [employer] in the past lost 45 pounds but got tired of eating prepackaged meal.  She presents today to review program treatment options, initial physical assessment, and evaluation.      Past medical history includes:   Past Medical History:  Diagnosis Date   Hypertension    PCOS (polycystic ovarian syndrome)      Objective:   BP (!) 137/90   Pulse (!) 106   Ht 5\' 6"  (1.676 m)   Wt 268 lb 12.8 oz (121.9 kg)   SpO2 98%   BMI 43.39 kg/m  She was weighed on the bioimpedance scale:  Body mass index is 43.39 kg/m.  General:  Alert, oriented and cooperative. Patient is in no acute distress.  Respiratory: Normal respiratory effort, no problems with respiration noted  Extremities: Normal range of motion.    Mental Status: Normal mood and affect. Normal behavior. Normal judgment and thought content.   Assessment and Plan:  1. Class 3 severe obesity with serious comorbidity and body mass index (BMI) of 40.0 to 44.9 in adult, unspecified obesity type (HCC)  2. Primary  hypertension   We reviewed weight, associated conditions and contributing factors.  We also discussed the benefits of weight loss therapy.  She would benefit from a reduced calorie high-protein nutritional plan and also strengthening.  Her blood pressure is elevated today.  I also reviewed previous readings and they have been elevated.  I counseled her on risk associated with untreated hypertension.  She will monitor at home, log and reach out to her primary care provider if above goal.  I have reviewed most recent renal parameters,  lipid panel and they are within acceptable range.  We will check fasting blood sugar, A1c and insulin levels in the future.    Obesity Treatment Plan:  She will work on garnering support from family and friends to begin weight loss journey. Work on eliminating or reducing the presence of highly processed, calorie dense foods in the home. Complete provided nutritional and psychosocial assessment questionnaire.    Cyndy Braver will follow up in the next 1-2 weeks to review the above steps and continue evaluation and treatment.  Obesity Education Performed Today:  She was weighed on the bioimpedance scale and results were discussed and documented in the synopsis.  We discussed obesity as a disease and the importance of a more detailed evaluation of all the factors contributing to the disease.  We discussed the importance of long term lifestyle changes which include nutrition, exercise and behavioral modifications as well as the importance of customizing this to her specific  health and social needs.  We discussed the benefits of reaching a healthier weight to alleviate the symptoms of existing conditions and reduce the risks of the biomechanical, metabolic and psychological effects of obesity.  We discussed the goals of this program is to improve her overall health and not simply achieve a specific BMI.  Frequent visits are very important to patient success. I plan to  see her every 2 weeks for the first 3 months and then evaluate the visit frequency after that time. I explained obesity is a life-long chronic disease and long term treatments would be required. Medications to help her follow his eating plan may be offered as appropriate but are not required. All medication decisions will be made together after the initial workup is done and benefits and side effects are discussed in depth.  The clinic rules were reviewed including the late policy, cancellation policy, no show and program fees.  Jeremie Abdelaziz appears to be in the action stage of change and states they are ready to start intensive lifestyle modifications and behavioral modifications.  30 minutes was spent today on this visit including the above counseling, pre-visit chart review, and post-visit documentation.  Thomes Dinning, MD

## 2021-12-13 ENCOUNTER — Ambulatory Visit (INDEPENDENT_AMBULATORY_CARE_PROVIDER_SITE_OTHER): Payer: BC Managed Care – PPO | Admitting: Internal Medicine

## 2021-12-13 ENCOUNTER — Encounter (INDEPENDENT_AMBULATORY_CARE_PROVIDER_SITE_OTHER): Payer: Self-pay | Admitting: Internal Medicine

## 2021-12-13 VITALS — BP 131/90 | HR 77 | Temp 98.3°F | Ht 66.0 in | Wt 264.8 lb

## 2021-12-13 DIAGNOSIS — Z6841 Body Mass Index (BMI) 40.0 and over, adult: Secondary | ICD-10-CM

## 2021-12-13 DIAGNOSIS — E7849 Other hyperlipidemia: Secondary | ICD-10-CM | POA: Insufficient documentation

## 2021-12-13 DIAGNOSIS — R0602 Shortness of breath: Secondary | ICD-10-CM | POA: Diagnosis not present

## 2021-12-13 DIAGNOSIS — I10 Essential (primary) hypertension: Secondary | ICD-10-CM | POA: Insufficient documentation

## 2021-12-13 DIAGNOSIS — G4733 Obstructive sleep apnea (adult) (pediatric): Secondary | ICD-10-CM | POA: Insufficient documentation

## 2021-12-13 DIAGNOSIS — Z1331 Encounter for screening for depression: Secondary | ICD-10-CM | POA: Diagnosis not present

## 2021-12-13 DIAGNOSIS — R5383 Other fatigue: Secondary | ICD-10-CM | POA: Insufficient documentation

## 2021-12-13 DIAGNOSIS — E66813 Obesity, class 3: Secondary | ICD-10-CM | POA: Insufficient documentation

## 2021-12-14 LAB — LIPID PANEL WITH LDL/HDL RATIO
Cholesterol, Total: 174 mg/dL (ref 100–199)
HDL: 43 mg/dL (ref >39)
LDL Chol Calc (NIH): 113 mg/dL — ABNORMAL HIGH (ref 0–99)
LDL/HDL Ratio: 2.6 ratio (ref 0.0–3.2)
Triglycerides: 98 mg/dL (ref 0–149)
VLDL Cholesterol Cal: 18 mg/dL (ref 5–40)

## 2021-12-14 LAB — VITAMIN B12: Vitamin B-12: 354 pg/mL (ref 232–1245)

## 2021-12-14 LAB — INSULIN, RANDOM: INSULIN: 24 u[IU]/mL (ref 2.6–24.9)

## 2021-12-18 NOTE — Progress Notes (Signed)
Chief Complaint:   OBESITY Tami Swanson (MR# [id number]) is a 31 y.o. female who presents for evaluation and treatment of obesity and related comorbidities. Current BMI is Body mass index is 42.74 kg/m. Tami Swanson has been struggling with her weight for many years and has been unsuccessful in either losing weight, maintaining weight loss, or reaching her healthy weight goal.  Tami Swanson is currently in the action stage of change and ready to dedicate time achieving and maintaining a healthier weight. Tami Swanson is interested in becoming our patient and working on intensive lifestyle modifications including (but not limited to) diet and exercise for weight loss.  Tami Swanson is down 4 pounds since her information session visit.  She is walking 20 to 30 minutes 2 times per week.  Tami Swanson's habits were reviewed today and are as follows: Her family eats meals together, she thinks her family will eat healthier with her, her desired weight loss is 84 lbs, she has been heavy most of her life, she started gaining weight after high school, her heaviest weight ever was 268.8 pounds, she has significant food cravings issues, she skips meals frequently, she is frequently drinking liquids with calories, she frequently makes poor food choices, she frequently eats larger portions than normal, and she struggles with emotional eating.  Depression Screen Tami Swanson's Food and Mood (modified PHQ-9) score was 16.     12/13/2021    8:30 AM  Depression screen PHQ 2/9  Decreased Interest 3  Down, Depressed, Hopeless 3  PHQ - 2 Score 6  Altered sleeping 3  Tired, decreased energy 3  Change in appetite 1  Feeling bad or failure about yourself  1  Trouble concentrating 0  Moving slowly or fidgety/restless 2  Suicidal thoughts 0  PHQ-9 Score 16  Difficult doing work/chores Somewhat difficult   Subjective:   1. Other fatigue Tami Swanson admits to daytime somnolence and admits to waking up still tired. Patient has a history of  symptoms of daytime fatigue, morning fatigue, and morning headache. Tami Swanson generally gets 6 hours of sleep per night, and states that she has nightime awakenings. Snoring is present. Apneic episodes are not present. Epworth Sleepiness Score is 10.  Shelvie's last A1c was 5.4 in April 2023.  CBC, CMP, TSH within normal limits.  2. SOB (shortness of breath) Tami Swanson notes increasing shortness of breath with exercising and seems to be worsening over time with weight gain. She notes getting out of breath sooner with activity than she used to. This has not gotten worse recently. Tami Swanson denies shortness of breath at rest or orthopnea.  3. Hypertension, essential Tami Swanson's blood pressure is above target of 120/80.  She is not on medications.  Her last TSH and renal parameters were within normal limits in April 2023.  I discussed labs with the patient today.  4. OSA (obstructive sleep apnea)-suspected Tami Swanson's Epworth score is 10.  She is a high risk phenotype.  5. Other hyperlipidemia Tami Swanson had elevated triglycerides, and mild LDL elevation.  Her last TSH was within normal limits.  I discussed labs with the patient today.  Assessment/Plan:   1. Other fatigue Tami Swanson does feel that her weight is causing her energy to be lower than it should be. Fatigue may be related to obesity, depression or many other causes. Labs will be ordered, and in the meanwhile, Tami Swanson will focus on self care including making healthy food choices, increasing physical activity and focusing on stress reduction.  - Vitamin B12 - Comprehensive metabolic panel; Future -  Insulin, random - VITAMIN D 25 Hydroxy (Vit-D Deficiency, Fractures); Future - EKG 12-Lead  2. SOB (shortness of breath) Tami Swanson does feel that she gets out of breath more easily that she used to when she exercises. Tami Swanson's shortness of breath appears to be obesity related and exercise induced. She has agreed to work on weight loss and gradually increase exercise to  treat her exercise induced shortness of breath. Will continue to monitor closely.  3. Hypertension, essential Tami Swanson will continue to monitor, and she will work on her weight loss therapy and decrease sodium in her diet.  4. OSA (obstructive sleep apnea)-suspected Tami Swanson will consider [facility] referral at her next visit.  She will work on a total weight loss of 10-15% to help.   5. Other hyperlipidemia We will check labs today.  Tami Swanson's levels should improve with her low carbohydrate meal plan.  We will check insulin levels for insulin resistance.  - Lipid Panel With LDL/HDL Ratio  6. Depression screening Tami Swanson had a positive depression screening. Depression is commonly associated with obesity and often results in emotional eating behaviors. We will monitor this closely and work on CBT to help improve the non-hunger eating patterns. Referral to Psychology may be required if no improvement is seen as she continues in our clinic.  7. Class 3 severe obesity without serious comorbidity with body mass index (BMI) of 40.0 to 44.9 in adult, unspecified obesity type [facility]) Tami Swanson is currently in the action stage of change and her goal is to continue with weight loss efforts. I recommend Tami Swanson begin the structured treatment plan as follows:  She has agreed to the Category 3 Plan.  Snack options were provided.   Exercise goals: 10,000 steps.   Behavioral modification strategies: increasing lean protein intake, decreasing simple carbohydrates, increasing water intake, decreasing liquid calories, decreasing sodium intake, decreasing eating out, no skipping meals, meal planning and cooking strategies, keeping healthy foods in the home, avoiding temptations, and planning for success.  She was informed of the importance of frequent follow-up visits to maximize her success with intensive lifestyle modifications for her multiple health conditions. She was informed we would discuss her lab results at her  next visit unless there is a critical issue that needs to be addressed sooner. Tami Swanson agreed to keep her next visit at the agreed upon time to discuss these results.  Objective:   Blood pressure (!) 131/90, pulse 77, temperature 98.3 F (36.8 C), height 5\' 6"  (1.676 m), weight 264 lb 12.8 oz (120.1 kg), SpO2 98 %, unknown if currently breastfeeding. Body mass index is 42.74 kg/m.  EKG: Normal sinus rhythm, rate 82 BPM.  Indirect Calorimeter completed today shows a VO2 of 291 and a REE of 2002.  Her calculated basal metabolic rate is 5621 thus her basal metabolic rate is worse than expected.  General: Cooperative, alert, well developed, in no acute distress. HEENT: Conjunctivae and lids unremarkable. Cardiovascular: Regular rhythm.  Lungs: Normal work of breathing. Neurologic: No focal deficits.   Lab Results  Component Value Date   CREATININE 0.96 06/13/2021   BUN 11 06/13/2021   NA 139 06/13/2021   K 4.2 06/13/2021   CL 102 06/13/2021   CO2 22 06/13/2021   Lab Results  Component Value Date   ALT 23 06/13/2021   AST 20 06/13/2021   ALKPHOS 88 06/13/2021   BILITOT 0.2 06/13/2021   Lab Results  Component Value Date   HGBA1C 5.4 06/13/2021   HGBA1C 5.1 10/07/2019   HGBA1C  5.3 12/04/2017   HGBA1C 5.2 06/18/2017   Lab Results  Component Value Date   INSULIN 24.0 12/13/2021   Lab Results  Component Value Date   TSH 1.740 06/13/2021   Lab Results  Component Value Date   CHOL 174 12/13/2021   HDL 43 12/13/2021   LDLCALC 113 (H) 12/13/2021   TRIG 98 12/13/2021   CHOLHDL 3.6 06/13/2021   Lab Results  Component Value Date   WBC 8.5 06/13/2021   HGB 13.5 06/13/2021   HCT 39.3 06/13/2021   MCV 86 06/13/2021   PLT 328 06/13/2021   Lab Results  Component Value Date   IRON 85 06/18/2017   TIBC 295 06/18/2017   FERRITIN 30 06/18/2017   Attestation Statements:   Reviewed by clinician on day of visit: allergies, medications, problem list, medical history,  surgical history, family history, social history, and previous encounter notes.  Time spent on visit including pre-visit chart review and post-visit charting and care was 40 minutes.   Trude Mcburney, am acting as transcriptionist for Worthy Rancher, MD.  I have reviewed the above documentation for accuracy and completeness, and I agree with the above. -Worthy Rancher, MD

## 2021-12-24 NOTE — Progress Notes (Incomplete)
Chief Complaint:   OBESITY Tami Swanson (MR# [id number]) is a 31 y.o. female who presents for evaluation and treatment of obesity and related comorbidities. Current BMI is Body mass index is 42.74 kg/m. Tami Swanson has been struggling with her weight for many years and has been unsuccessful in either losing weight, maintaining weight loss, or reaching her healthy weight goal.  Tami Swanson is currently in the action stage of change and ready to dedicate time achieving and maintaining a healthier weight. Tami Swanson is interested in becoming our patient and working on intensive lifestyle modifications including (but not limited to) diet and exercise for weight loss.  Tami Swanson is down 4 pounds since her information session visit.  She is walking 20 to 30 minutes 2 times per week.  Tami Swanson's habits were reviewed today and are as follows: {MWM WT HABITS:23461}.  Depression Screen Tami Swanson's Food and Mood (modified PHQ-9) score was 16.     12/13/2021    8:30 AM  Depression screen PHQ 2/9  Decreased Interest 3  Down, Depressed, Hopeless 3  PHQ - 2 Score 6  Altered sleeping 3  Tired, decreased energy 3  Change in appetite 1  Feeling bad or failure about yourself  1  Trouble concentrating 0  Moving slowly or fidgety/restless 2  Suicidal thoughts 0  PHQ-9 Score 16  Difficult doing work/chores Somewhat difficult   Subjective:   1. Other fatigue Tami Swanson {Actions; denies/reports/admits to:19208} daytime somnolence and {Actions; denies/reports/admits to:19208} waking up still tired. Patient has a history of symptoms of {OSA Sx:17850}. Tami Swanson generally gets {numbers (fuzzy):14653} hours of sleep per night, and states that she has {sleep quality:17851}. Snoring {is/are not:32546} present. Apneic episodes {is/are not:32546} present. Epworth Sleepiness Score is ***.  Tami Swanson's last A1c was 5.4 in April 2023.  CBC, CMP, TSH within normal limits.  2. SOB (shortness of breath) Tami Swanson notes increasing shortness of  breath with exercising and seems to be worsening over time with weight gain. She notes getting out of breath sooner with activity than she used to. This has not gotten worse recently. Tami Swanson denies shortness of breath at rest or orthopnea.  3. Hypertension, essential Tami Swanson's blood pressure is above target of 120/80.  She is not on medications.  Her last TSH and renal parameters were within normal limits in April 2023.  I discussed labs with the patient today.  4. OSA (obstructive sleep apnea)-suspected Tami Swanson's Epworth score is 10.  She is a high risk phenotype.  5. Other hyperlipidemia Tami Swanson had elevated triglycerides, and mild LDL elevation.  Her last TSH was within normal limits.  I discussed labs with the patient today.  Assessment/Plan:   1. Other fatigue Tami Swanson does feel that her weight is causing her energy to be lower than it should be. Fatigue may be related to obesity, depression or many other causes. Labs will be ordered, and in the meanwhile, Tami Swanson will focus on self care including making healthy food choices, increasing physical activity and focusing on stress reduction.  - Vitamin B12 - Comprehensive metabolic panel; Future - Insulin, random - VITAMIN D 25 Hydroxy (Vit-D Deficiency, Fractures); Future - EKG 12-Lead  2. SOB (shortness of breath) Tami Swanson does feel that she gets out of breath more easily that she used to when she exercises. Tami Swanson's shortness of breath appears to be obesity related and exercise induced. She has agreed to work on weight loss and gradually increase exercise to treat her exercise induced shortness of breath. Will continue to monitor closely.  3. Hypertension, essential Tami Swanson will continue to monitor, and she will work on her weight loss therapy and decrease sodium in her diet.  4. OSA (obstructive sleep apnea)-suspected Tami Swanson will consider [facility] referral at her next visit.  She will work on a total weight loss of 10-15%  5. Other  hyperlipidemia *** - Lipid Panel With LDL/HDL Ratio  6. Depression screening Tami Swanson had a positive depression screening. Depression is commonly associated with obesity and often results in emotional eating behaviors. We will monitor this closely and work on CBT to help improve the non-hunger eating patterns. Referral to Psychology may be required if no improvement is seen as she continues in our clinic.  7. Class 3 severe obesity without serious comorbidity with body mass index (BMI) of 40.0 to 44.9 in adult, unspecified obesity type Tami Swanson) Tami Swanson is currently in the action stage of change and her goal is to continue with weight loss efforts. I recommend Tami Swanson begin the structured treatment plan as follows:  She has agreed to the Category 3 Plan.  Snack options were provided.   Exercise goals: 10,000 steps.   Behavioral modification strategies: increasing lean protein intake, decreasing simple carbohydrates, increasing water intake, decreasing liquid calories, decreasing sodium intake, decreasing eating out, no skipping meals, meal planning and cooking strategies, keeping healthy foods in the home, avoiding temptations, and planning for success.  She was informed of the importance of frequent follow-up visits to maximize her success with intensive lifestyle modifications for her multiple health conditions. She was informed we would discuss her lab results at her next visit unless there is a critical issue that needs to be addressed sooner. Tami Swanson agreed to keep her next visit at the agreed upon time to discuss these results.  Objective:   Blood pressure (!) 131/90, pulse 77, temperature 98.3 F (36.8 C), height 5\' 6"  (1.676 m), weight 264 lb 12.8 oz (120.1 kg), SpO2 98 %, unknown if currently breastfeeding. Body mass index is 42.74 kg/m.  EKG: Normal sinus rhythm, rate 82 BPM.  Indirect Calorimeter completed today shows a VO2 of 291 and a REE of 2002.  Her calculated basal metabolic rate  is 2034 thus her basal metabolic rate is worse than expected.  General: Cooperative, alert, well developed, in no acute distress. HEENT: Conjunctivae and lids unremarkable. Cardiovascular: Regular rhythm.  Lungs: Normal work of breathing. Neurologic: No focal deficits.   Lab Results  Component Value Date   CREATININE 0.96 06/13/2021   BUN 11 06/13/2021   NA 139 06/13/2021   K 4.2 06/13/2021   CL 102 06/13/2021   CO2 22 06/13/2021   Lab Results  Component Value Date   ALT 23 06/13/2021   AST 20 06/13/2021   ALKPHOS 88 06/13/2021   BILITOT 0.2 06/13/2021   Lab Results  Component Value Date   HGBA1C 5.4 06/13/2021   HGBA1C 5.1 10/07/2019   HGBA1C 5.3 12/04/2017   HGBA1C 5.2 06/18/2017   Lab Results  Component Value Date   INSULIN 24.0 12/13/2021   Lab Results  Component Value Date   TSH 1.740 06/13/2021   Lab Results  Component Value Date   CHOL 174 12/13/2021   HDL 43 12/13/2021   LDLCALC 113 (H) 12/13/2021   TRIG 98 12/13/2021   CHOLHDL 3.6 06/13/2021   Lab Results  Component Value Date   WBC 8.5 06/13/2021   HGB 13.5 06/13/2021   HCT 39.3 06/13/2021   MCV 86 06/13/2021   PLT 328 06/13/2021   Lab Results  Component Value Date   IRON 85 06/18/2017   TIBC 295 06/18/2017   FERRITIN 30 06/18/2017   Attestation Statements:   Reviewed by clinician on day of visit: allergies, medications, problem list, medical history, surgical history, family history, social history, and previous encounter notes.  Time spent on visit including pre-visit chart review and post-visit charting and care was 40 minutes.   I, Burt Knack, am acting as transcriptionist for Worthy Rancher, MD.  I have reviewed the above documentation for accuracy and completeness, and I agree with the above. - ***

## 2021-12-27 ENCOUNTER — Encounter (INDEPENDENT_AMBULATORY_CARE_PROVIDER_SITE_OTHER): Payer: Self-pay | Admitting: Internal Medicine

## 2021-12-27 ENCOUNTER — Ambulatory Visit (INDEPENDENT_AMBULATORY_CARE_PROVIDER_SITE_OTHER): Payer: BC Managed Care – PPO | Admitting: Internal Medicine

## 2021-12-27 VITALS — BP 135/85 | HR 85 | Temp 98.2°F | Ht 66.0 in | Wt 265.0 lb

## 2021-12-27 DIAGNOSIS — R5383 Other fatigue: Secondary | ICD-10-CM | POA: Diagnosis not present

## 2021-12-27 DIAGNOSIS — I10 Essential (primary) hypertension: Secondary | ICD-10-CM | POA: Diagnosis not present

## 2021-12-27 DIAGNOSIS — R29818 Other symptoms and signs involving the nervous system: Secondary | ICD-10-CM

## 2021-12-27 DIAGNOSIS — E161 Other hypoglycemia: Secondary | ICD-10-CM

## 2021-12-27 DIAGNOSIS — E669 Obesity, unspecified: Secondary | ICD-10-CM

## 2021-12-27 DIAGNOSIS — Z6841 Body Mass Index (BMI) 40.0 and over, adult: Secondary | ICD-10-CM | POA: Diagnosis not present

## 2021-12-27 DIAGNOSIS — G4733 Obstructive sleep apnea (adult) (pediatric): Secondary | ICD-10-CM

## 2021-12-27 MED ORDER — BUPROPION HCL ER (SR) 100 MG PO TB12: 100.0000 mg | ORAL_TABLET | Freq: Every day | ORAL | 0 refills | Status: DC

## 2021-12-28 LAB — CBC WITH DIFFERENTIAL
Basophils Absolute: 0.1 10*3/uL (ref 0.0–0.2)
Basos: 1 %
EOS (ABSOLUTE): 0.2 10*3/uL (ref 0.0–0.4)
Eos: 3 %
Hematocrit: 40.2 % (ref 34.0–46.6)
Hemoglobin: 13.8 g/dL (ref 11.1–15.9)
Immature Grans (Abs): 0 10*3/uL (ref 0.0–0.1)
Immature Granulocytes: 0 %
Lymphocytes Absolute: 2.5 10*3/uL (ref 0.7–3.1)
Lymphs: 34 %
MCH: 29.9 pg (ref 26.6–33.0)
MCHC: 34.3 g/dL (ref 31.5–35.7)
MCV: 87 fL (ref 79–97)
Monocytes Absolute: 0.6 10*3/uL (ref 0.1–0.9)
Monocytes: 8 %
Neutrophils Absolute: 3.9 10*3/uL (ref 1.4–7.0)
Neutrophils: 54 %
RBC: 4.61 x10E6/uL (ref 3.77–5.28)
RDW: 11.7 % (ref 11.7–15.4)
WBC: 7.2 10*3/uL (ref 3.4–10.8)

## 2021-12-28 LAB — CMP14+EGFR
ALT: 32 IU/L (ref 0–32)
AST: 22 IU/L (ref 0–40)
Albumin/Globulin Ratio: 2 (ref 1.2–2.2)
Albumin: 4.7 g/dL (ref 3.9–4.9)
Alkaline Phosphatase: 88 IU/L (ref 44–121)
BUN/Creatinine Ratio: 10 (ref 9–23)
BUN: 7 mg/dL (ref 6–20)
Bilirubin Total: 0.4 mg/dL (ref 0.0–1.2)
CO2: 25 mmol/L (ref 20–29)
Calcium: 9.5 mg/dL (ref 8.7–10.2)
Chloride: 100 mmol/L (ref 96–106)
Creatinine, Ser: 0.68 mg/dL (ref 0.57–1.00)
Globulin, Total: 2.4 g/dL (ref 1.5–4.5)
Glucose: 77 mg/dL (ref 70–99)
Potassium: 4.5 mmol/L (ref 3.5–5.2)
Sodium: 138 mmol/L (ref 134–144)
Total Protein: 7.1 g/dL (ref 6.0–8.5)
eGFR: 118 mL/min/{1.73_m2} (ref >59)

## 2021-12-28 LAB — TSH: TSH: 2.68 u[IU]/mL (ref 0.450–4.500)

## 2021-12-28 LAB — HEMOGLOBIN A1C
Est. average glucose Bld gHb Est-mCnc: 105 mg/dL
Hgb A1c MFr Bld: 5.3 % (ref 4.8–5.6)

## 2021-12-28 LAB — VITAMIN D 25 HYDROXY (VIT D DEFICIENCY, FRACTURES): Vit D, 25-Hydroxy: 24 ng/mL — ABNORMAL LOW (ref 30.0–100.0)

## 2022-01-08 NOTE — Progress Notes (Unsigned)
Chief Complaint:   OBESITY Flecia is here to discuss her progress with her obesity treatment plan along with follow-up of her obesity related diagnoses. Chester is on the Category 3 Plan and states she is following her eating plan approximately 50% of the time. Rickayla states she is doing 0 minutes 0 times per week.  Today's visit was #: 2 Starting weight: 264 lbs Starting date: 12/13/2021 Today's weight: 265 lbs Today's date: 12/27/2021 Total lbs lost to date: 0 Total lbs lost since last in-office visit: 0  Interim History: Saadia has been gradually implementing her nutritional plan.  She has decreased sugary drinks and she has been more mindful about her food choices.  She notes feeling foggy headed at times with daytime fatigue and unrefreshed sleep.  Her husband notes snoring.  Her labs were not completed due to internal processing error.  Subjective:   1. Other fatigue Daila's last labs were not completed. Suspected obstructive sleep apnea and insulin resistance. Her last B12 level was within normal limits.   2. OSA (obstructive sleep apnea), suspected Lilana's neck size is 16 3/4, MP3. She had an elevated Epworth score.  Reviewed possible struct of sleep apnea, risk and the need for additional testing.  3. Hyperinsulinemia Branae's level was 24.  This may contribute to cravings and weight gain.  I discussed labs with the patient today.  Assessment/Plan:   1. Other fatigue We will check labs today.  Rosalva will continue with her weight loss therapy.  - CBC With Differential - CMP14+EGFR - VITAMIN D 25 Hydroxy (Vit-D Deficiency, Fractures) - Hemoglobin A1c - TSH  2. OSA (obstructive sleep apnea), suspected Mailani will think about having a sleep study.  She will continue with her weight loss therapy, and she was advised that a 15% total weight loss would improve her symptoms.  3. Hyperinsulinemia Daphyne will continue with her low carbohydrate meal plan, and she will  continue with her weight loss therapy.  She will also avoid added sugars.  4. Obesity with current BMI of 42.8 Apple is currently in the action stage of change. As such, her goal is to continue with weight loss efforts. She has agreed to the Category 3 Plan.   Exercise goals: All adults should avoid inactivity. Some physical activity is better than none, and adults who participate in any amount of physical activity gain some health benefits.  Behavioral modification strategies: increasing lean protein intake, no skipping meals, holiday eating strategies , celebration eating strategies, and planning for success.  Jamonica has agreed to follow-up with our clinic in 2 weeks. She was informed of the importance of frequent follow-up visits to maximize her success with intensive lifestyle modifications for her multiple health conditions.   Ninel was informed we would discuss her lab results at her next visit unless there is a critical issue that needs to be addressed sooner. Katlyn agreed to keep her next visit at the agreed upon time to discuss these results.  Objective:   Blood pressure 135/85, pulse 85, temperature 98.2 F (36.8 C), height _0  (1.676 m), weight 265 lb (120.2 kg), SpO2 99 %, unknown if currently breastfeeding. Body mass index is 42.77 kg/m.  General: Cooperative, alert, well developed, in no acute distress. HEENT: Conjunctivae and lids unremarkable. Cardiovascular: Regular rhythm.  Lungs: Normal work of breathing. Neurologic: No focal deficits.   Lab Results  Component Value Date   CREATININE 0.68 12/27/2021   BUN 7 12/27/2021   NA 138 12/27/2021  K 4.5 12/27/2021   CL 100 12/27/2021   CO2 25 12/27/2021   Lab Results  Component Value Date   ALT 32 12/27/2021   AST 22 12/27/2021   ALKPHOS 88 12/27/2021   BILITOT 0.4 12/27/2021   Lab Results  Component Value Date   HGBA1C 5.3 12/27/2021   HGBA1C 5.4 06/13/2021   HGBA1C 5.1 10/07/2019   HGBA1C 5.3  12/04/2017   HGBA1C 5.2 06/18/2017   Lab Results  Component Value Date   INSULIN 24.0 12/13/2021   Lab Results  Component Value Date   TSH 2.680 12/27/2021   Lab Results  Component Value Date   CHOL 174 12/13/2021   HDL 43 12/13/2021   LDLCALC 113 (H) 12/13/2021   TRIG 98 12/13/2021   CHOLHDL 3.6 06/13/2021   Lab Results  Component Value Date   VD25OH 24.0 (L) 12/27/2021   Lab Results  Component Value Date   WBC 7.2 12/27/2021   HGB 13.8 12/27/2021   HCT 40.2 12/27/2021   MCV 87 12/27/2021   PLT 328 06/13/2021   Lab Results  Component Value Date   IRON 85 06/18/2017   TIBC 295 06/18/2017   FERRITIN 30 06/18/2017   Attestation Statements:   Reviewed by clinician on day of visit: allergies, medications, problem list, medical history, surgical history, family history, social history, and previous encounter notes.  Time spent on visit including pre-visit chart review and post-visit care and charting was 40 minutes.   I, Trixie Dredge, am acting as transcriptionist for Thomes Dinning, MD.  I have reviewed the above documentation for accuracy and completeness, and I agree with the above. -  ***

## 2022-01-10 ENCOUNTER — Encounter (INDEPENDENT_AMBULATORY_CARE_PROVIDER_SITE_OTHER): Payer: Self-pay | Admitting: Internal Medicine

## 2022-01-10 ENCOUNTER — Ambulatory Visit (INDEPENDENT_AMBULATORY_CARE_PROVIDER_SITE_OTHER): Payer: BC Managed Care – PPO | Admitting: Internal Medicine

## 2022-01-10 VITALS — BP 127/86 | HR 73 | Temp 98.1°F | Ht 66.0 in | Wt 263.0 lb

## 2022-01-10 DIAGNOSIS — E669 Obesity, unspecified: Secondary | ICD-10-CM | POA: Diagnosis not present

## 2022-01-10 DIAGNOSIS — F419 Anxiety disorder, unspecified: Secondary | ICD-10-CM | POA: Diagnosis not present

## 2022-01-10 DIAGNOSIS — E161 Other hypoglycemia: Secondary | ICD-10-CM | POA: Diagnosis not present

## 2022-01-10 DIAGNOSIS — E559 Vitamin D deficiency, unspecified: Secondary | ICD-10-CM

## 2022-01-10 DIAGNOSIS — Z6841 Body Mass Index (BMI) 40.0 and over, adult: Secondary | ICD-10-CM

## 2022-01-10 MED ORDER — METFORMIN HCL ER 500 MG PO TB24: 500.0000 mg | ORAL_TABLET | Freq: Every day | ORAL | 0 refills | Status: DC

## 2022-01-10 MED ORDER — VITAMIN D3 50 MCG (2000 UT) PO CAPS: 4000.0000 [IU] | ORAL_CAPSULE | Freq: Every day | ORAL | Status: DC

## 2022-01-15 ENCOUNTER — Encounter (INDEPENDENT_AMBULATORY_CARE_PROVIDER_SITE_OTHER): Payer: Self-pay | Admitting: Internal Medicine

## 2022-01-24 ENCOUNTER — Ambulatory Visit (INDEPENDENT_AMBULATORY_CARE_PROVIDER_SITE_OTHER): Payer: BC Managed Care – PPO | Admitting: Internal Medicine

## 2022-01-27 NOTE — Progress Notes (Unsigned)
Chief Complaint:   OBESITY Tami Swanson is here to discuss her progress with her obesity treatment plan along with follow-up of her obesity related diagnoses. Tami Swanson is on the Category 3 Plan and states she is following her eating plan approximately 80% of the time. Tami Swanson states she is doing 8,000-10,000 steps daily.  Today's visit was #: 3 Starting weight: 264 lbs Starting date: 12/13/2021 Today's weight: 263 lbs Today's date: 01/10/2022 Total lbs lost to date: 1 Total lbs lost since last in-office visit: 2  Interim History: We started Tami Swanson on bupropion at last office visit. She feels medication is making her more irritable and anxious. She denies insomnia. Pt reports adequate satiety and satiation. She is experiencing cravings around stressful situations.  Subjective:   1. Hyperinsulinemia Discussed labs with patient today. Worsening. Diagnosis contributing to cravings.  2. Anxiety Anxiety due to high levels of stress with occasional irritability.  3. Vitamin D deficiency She is currently taking no vitamin D supplement. She denies nausea, vomiting or muscle weakness.  Assessment/Plan:   1. Hyperinsulinemia After discussion of disease state, medication benefits, side effects, off label use and shared decision making.  We will start Metformin 500 mg once daily.   2. Anxiety Complete self-administered GAD7 for next office visit.  May benefit from an SSRI.  3. Vitamin D deficiency She agrees to start OTC Vitamin D 4,000 IU daily and will follow-up for routine testing of Vitamin D, at least 2-3 times per year to avoid over-replacement. Recheck levels in 3 months.  Start- Cholecalciferol (VITAMIN D3) 50 MCG (2000 UT) capsule; Take 2 capsules (4,000 Units total) by mouth daily.  4. Obesity with current BMI of 42.5 Tami Swanson is currently in the action stage of change. As such, her goal is to continue with weight loss efforts. She has agreed to the Category 3 Plan.   Discontinue  bupropion. Start Metformin XR 500 mg daily. Start- metFORMIN (GLUCOPHAGE-XR) 500 MG 24 hr tablet; Take 1 tablet (500 mg total) by mouth daily with breakfast.  Dispense: 30 tablet; Refill: 0  Exercise goals:  Pt is planning to walk at lunch time.  Behavioral modification strategies: increasing lean protein intake, decreasing simple carbohydrates, increasing water intake, no skipping meals, meal planning and cooking strategies, and emotional eating strategies.  Edward has agreed to follow-up with our clinic in 3-4 weeks. She was informed of the importance of frequent follow-up visits to maximize her success with intensive lifestyle modifications for her multiple health conditions.   Objective:   Blood pressure 127/86, pulse 73, temperature 98.1 F (36.7 C), height 5\' 6"  (1.676 m), weight 263 lb (119.3 kg), SpO2 98 %, unknown if currently breastfeeding. Body mass index is 42.45 kg/m.  General: Cooperative, alert, well developed, in no acute distress. HEENT: Conjunctivae and lids unremarkable. Cardiovascular: Regular rhythm.  Lungs: Normal work of breathing. Neurologic: No focal deficits.   Lab Results  Component Value Date   CREATININE 0.68 12/27/2021   BUN 7 12/27/2021   NA 138 12/27/2021   K 4.5 12/27/2021   CL 100 12/27/2021   CO2 25 12/27/2021   Lab Results  Component Value Date   ALT 32 12/27/2021   AST 22 12/27/2021   ALKPHOS 88 12/27/2021   BILITOT 0.4 12/27/2021   Lab Results  Component Value Date   HGBA1C 5.3 12/27/2021   HGBA1C 5.4 06/13/2021   HGBA1C 5.1 10/07/2019   HGBA1C 5.3 12/04/2017   HGBA1C 5.2 06/18/2017   Lab Results  Component Value Date  INSULIN 24.0 12/13/2021   Lab Results  Component Value Date   TSH 2.680 12/27/2021   Lab Results  Component Value Date   CHOL 174 12/13/2021   HDL 43 12/13/2021   LDLCALC 113 (H) 12/13/2021   TRIG 98 12/13/2021   CHOLHDL 3.6 06/13/2021   Lab Results  Component Value Date   VD25OH 24.0 (L) 12/27/2021    Lab Results  Component Value Date   WBC 7.2 12/27/2021   HGB 13.8 12/27/2021   HCT 40.2 12/27/2021   MCV 87 12/27/2021   PLT 328 06/13/2021   Lab Results  Component Value Date   IRON 85 06/18/2017   TIBC 295 06/18/2017   FERRITIN 30 06/18/2017    Attestation Statements:   Reviewed by clinician on day of visit: allergies, medications, problem list, medical history, surgical history, family history, social history, and previous encounter notes.  I, Kyung Rudd, BS, CMA, am acting as transcriptionist for Worthy Rancher, MD.  I have reviewed the above documentation for accuracy and completeness, and I agree with the above. -Worthy Rancher, MD

## 2022-02-01 ENCOUNTER — Encounter (INDEPENDENT_AMBULATORY_CARE_PROVIDER_SITE_OTHER): Payer: Self-pay | Admitting: Internal Medicine

## 2022-02-03 ENCOUNTER — Other Ambulatory Visit (INDEPENDENT_AMBULATORY_CARE_PROVIDER_SITE_OTHER): Payer: Self-pay | Admitting: Internal Medicine

## 2022-02-07 ENCOUNTER — Ambulatory Visit (INDEPENDENT_AMBULATORY_CARE_PROVIDER_SITE_OTHER): Payer: BC Managed Care – PPO | Admitting: Internal Medicine

## 2022-08-30 ENCOUNTER — Other Ambulatory Visit (INDEPENDENT_AMBULATORY_CARE_PROVIDER_SITE_OTHER): Payer: BC Managed Care – PPO

## 2022-08-30 ENCOUNTER — Ambulatory Visit (INDEPENDENT_AMBULATORY_CARE_PROVIDER_SITE_OTHER): Payer: BC Managed Care – PPO | Admitting: *Deleted

## 2022-08-30 VITALS — BP 137/83 | HR 75 | Wt 256.0 lb

## 2022-08-30 DIAGNOSIS — Z3A01 Less than 8 weeks gestation of pregnancy: Secondary | ICD-10-CM | POA: Diagnosis not present

## 2022-08-30 DIAGNOSIS — O3680X0 Pregnancy with inconclusive fetal viability, not applicable or unspecified: Secondary | ICD-10-CM

## 2022-08-30 DIAGNOSIS — Z3481 Encounter for supervision of other normal pregnancy, first trimester: Secondary | ICD-10-CM

## 2022-08-30 NOTE — Progress Notes (Signed)
New OB Intake  I explained I am completing New OB Intake today.  Last Menstrual Period was 06/26/22, then had 10 days of spotting starting on 07/24/22. Pt is G2P1001. I reviewed her allergies, medications and Medical/Surgical/OB history.    Patient Active Problem List   Diagnosis Date Noted   Other fatigue 12/13/2021   SOB (shortness of breath) 12/13/2021   Depression screening 12/13/2021   Class 3 severe obesity without serious comorbidity with body mass index (BMI) of 40.0 to 44.9 in adult (HCC) 12/13/2021   Hypertension, essential 12/13/2021   OSA (obstructive sleep apnea)-suspected 12/13/2021   Other hyperlipidemia 12/13/2021   Anxiety and depression 10/07/2019   Hypertension    Obesity (BMI 30-39.9) 12/04/2017   Condyloma acuminata of vulva in pregnancy, unspecified trimester 12/03/2017   History of genital warts 12/03/2017   Hirsutism 12/03/2017   Tobacco dependence syndrome 06/19/2017    Concerns addressed today  Patient informed that the ultrasound is considered a limited obstetric ultrasound and is not intended to be a complete ultrasound exam.  Patient also informed that the ultrasound is not being completed with the intent of assessing for fetal or placental anomalies or any pelvic abnormalities. Explained that the purpose of today's ultrasound is to assess for viability.  Patient acknowledges the purpose of the exam and the limitations of the study.      Pt to return in 10 days for repeat scan. Was only able to see a gestational sac and a possible fetal pole.    [name], RN 08/30/2022  11:23 AM

## 2022-09-09 ENCOUNTER — Other Ambulatory Visit (INDEPENDENT_AMBULATORY_CARE_PROVIDER_SITE_OTHER): Payer: BC Managed Care – PPO

## 2022-09-09 ENCOUNTER — Ambulatory Visit: Payer: BC Managed Care – PPO | Admitting: Obstetrics & Gynecology

## 2022-09-09 ENCOUNTER — Encounter: Payer: Self-pay | Admitting: Obstetrics & Gynecology

## 2022-09-09 VITALS — BP 150/88 | HR 90

## 2022-09-09 DIAGNOSIS — F419 Anxiety disorder, unspecified: Secondary | ICD-10-CM | POA: Diagnosis not present

## 2022-09-09 DIAGNOSIS — O021 Missed abortion: Secondary | ICD-10-CM

## 2022-09-09 DIAGNOSIS — O3680X0 Pregnancy with inconclusive fetal viability, not applicable or unspecified: Secondary | ICD-10-CM | POA: Diagnosis not present

## 2022-09-09 DIAGNOSIS — Z3A01 Less than 8 weeks gestation of pregnancy: Secondary | ICD-10-CM | POA: Diagnosis not present

## 2022-09-09 DIAGNOSIS — F32A Depression, unspecified: Secondary | ICD-10-CM | POA: Diagnosis not present

## 2022-09-09 MED ORDER — IBUPROFEN 600 MG PO TABS: 600.0000 mg | ORAL_TABLET | Freq: Four times a day (QID) | ORAL | 2 refills | Status: DC | PRN

## 2022-09-09 MED ORDER — MISOPROSTOL 200 MCG PO TABS: ORAL_TABLET | ORAL | 1 refills | Status: DC

## 2022-09-09 MED ORDER — PROMETHAZINE HCL 25 MG PO TABS: 25.0000 mg | ORAL_TABLET | Freq: Four times a day (QID) | ORAL | 2 refills | Status: DC | PRN

## 2022-09-09 MED ORDER — HYDROCODONE-ACETAMINOPHEN 5-325 MG PO TABS: 2.0000 | ORAL_TABLET | Freq: Four times a day (QID) | ORAL | 0 refills | Status: DC | PRN

## 2022-09-09 MED ORDER — CITALOPRAM HYDROBROMIDE 20 MG PO TABS
20.0000 mg | ORAL_TABLET | Freq: Every day | ORAL | 12 refills | Status: DC
Start: 2022-09-09 — End: 2022-10-02

## 2022-09-09 NOTE — Progress Notes (Signed)
GYNECOLOGY OFFICE VISIT NOTE  History:   Tami Swanson is a 32 y.o. G2P1001 here today for follow up scan.  Was seen on 08/30/22, ultrasound showed likely 6 week missed abortion. Had a little bit of bleeding since then. She denies any abnormal vaginal discharge, pelvic pain or other concerns.    Past Medical History:  Diagnosis Date   Anxiety    Back pain    Depression    Hypertension    Joint pain    PCOS (polycystic ovarian syndrome)    SOB (shortness of breath)     Past Surgical History:  Procedure Laterality Date   CESAREAN SECTION N/A 06/10/2018   Procedure: CESAREAN SECTION;  Surgeon: Kathrynn Running, MD;  Location: [facility] LD ORS;  Service: Obstetrics;  Laterality: N/A;   FOOT SURGERY Right 2006    The following portions of the patient's history were reviewed and updated as appropriate: allergies, current medications, past family history, past medical history, past social history, past surgical history and problem list.   Health Maintenance:  Normal pap and negative HRHPV on 06/13/2021.  Review of Systems:  Pertinent items noted in HPI and remainder of comprehensive ROS otherwise negative.  Physical Exam:  BP (!) 150/88   Pulse 90   LMP 06/26/2022  CONSTITUTIONAL: Well-developed, well-nourished female in no acute distress.  MUSCULOSKELETAL: Normal range of motion. No edema noted. NEUROLOGIC: Alert and oriented to person, place, and time. Normal muscle tone coordination. No cranial nerve deficit noted. PSYCHIATRIC: Normal mood and affect. Normal behavior. Normal judgment and thought content. CARDIOVASCULAR: Normal heart rate noted RESPIRATORY: Effort and breath sounds normal, no problems with respiration noted ABDOMEN: No masses noted. No other overt distention noted.   PELVIC: Deferred  Labs and Imaging  U/S today: No changed from 08/30/2022 scan except larger IUGS.  Small fetal pole around 6 weeks, no cardiac activity.     US OB Limited  Result Date:  08/30/2022 ----------------------------------------------------------------------  OBSTETRICS REPORT                       (Signed Final 08/30/2022 11:50 am) ---------------------------------------------------------------------- Patient Info  ID #:       [id number]                          D.O.B.:  25-Feb-1991 (31 yrs)  Name:       Tami Swanson                     Visit Date: 08/30/2022 11:27 am ---------------------------------------------------------------------- Performed By  Attending:        Tinnie Gens MD         Ref. Address:     [address]                                                             [address]  Performed By:     [name] RN     [facility]  Referred By:      [facility] Mila Merry ---------------------------------------------------------------------- Orders  #  Description                           Code        Ordered By  1  US OB LIMITED                         54098.1     Tinnie Gens ----------------------------------------------------------------------  #  Order #                     Accession #                Episode #  1  [id number]                   [id number]                 [id number] ---------------------------------------------------------------------- Indications  Encounter for uncertain dates                  Z36.87 ---------------------------------------------------------------------- Fetal Evaluation  Num Of Fetuses:         1  Preg. Location:         Intrauterine  Gest. Sac:              Intrauterine  Yolk Sac:               Not visualized  Fetal Pole:             Not visualized  Cardiac Activity:       No embryo visualized ---------------------------------------------------------------------- Biometry  GS:       29.7  mm     G. Age:  8w 5d                    EDD:   04/06/23  CRL:         5  mm     G. Age:  6w 1d                   EDD:   04/24/23 ---------------------------------------------------------------------- OB History  Gravidity:    2         Term:   1        Prem:   0        SAB:   0  TOP:          0       Ectopic:  0        Living: 1 ---------------------------------------------------------------------- Impression  ? viable single IUP  no FHR noted  size is not c/w dates ---------------------------------------------------------------------- Recommendations  F/u  viability scan in 7-10 days ----------------------------------------------------------------------                  Tinnie Gens, MD Electronically Signed Final Report   08/30/2022 11:50 am ----------------------------------------------------------------------     Assessment and Plan:    1. Anxiety and depression Patient requested refill of Celexa, this was given. Also given information about [facility]. - citalopram (CELEXA) 20 MG tablet; Take 1 tablet (20 mg total) by mouth daily.  Dispense: 30 tablet; Refill: 12  2. Encounter to determine fetal viability of pregnancy, single or unspecified fetus 3. Missed abortion Discussed management of missed abortion: expectant management vs misoprostol vs Dilation and Evacuation.  Risks and benefits of all modalities discussed; all questions answered.  Patient opted for  misoprostol administration.  The protocol/checklist is below:   Early Intrauterine Pregnancy Failure Treatment with Misoprostol   _x__  Documented intrauterine pregnancy failure less than or equal to [redacted] weeks gestation   _x__  No serious current illness   _x__  Baseline Hgb greater than or equal to 10 g/dl(last one in 1610 was 13, checked one today)   _x__  Patient has easily accessible transportation to the hospital   _x__  Clear preference   _x__  Practitioner/physician deems patient reliable   _x__  Counseling by practitioner or physician   _x__  Patient education by  MD    _x__ Medication dispensed  - misoprostol (CYTOTEC) 200 MCG tablet; Place four tablets in between your gums and cheeks (two tablets on each side) or insert four tablets vaginally  Dispense: 4 tablet; Refill: 1 - ibuprofen (ADVIL) 600 MG tablet; Take 1 tablet (600 mg total) by mouth every 6 (six) hours as needed for headache, mild pain, moderate pain or cramping.  Dispense: 30 tablet; Refill: 2 - HYDROcodone-acetaminophen (NORCO/VICODIN) 5-325 MG tablet; Take 2 tablets by mouth every 6 (six) hours as needed for severe pain.  Dispense: 20 tablet; Refill: 0 - promethazine (PHENERGAN) 25 MG tablet; Take 1 tablet (25 mg total) by mouth every 6 (six) hours as needed for nausea or vomiting.  Dispense: 30 tablet; Refill: 2 - CBC          Risks and benefits of medical management were carefully explained, including a success rate of 80-90%, the need for another person to be at home with her, and to call/come in if she had heavy bleeding, dizziness, or severe pain not relieved by medication.  She will follow up in one week after misoprostol administration; if there has been no passage of pregnancy, will consider repeat misoprostol vs D&E.  Patient  advised to call or come in for evaluation for any concerning symptoms; bleeding precautions strictly emphasized.     If patient changes her mind and opts for D&E,the Attending on call needs to be notified and this will be scheduled for her sometime in the subsequent few days depending on surgeon and OR availability. Risks of surgery including bleeding, infection, injury to surrounding organs, need for additional procedures, possibility of intrauterine scarring which may impair future fertility, risk of retained products which may require further management and other postoperative/anesthesia complications will be explained to patient.  Please refer to After Visit Summary for other counseling recommendations.     Return in about 2 weeks (around 09/23/2022).    I spent  30 minutes dedicated to the care of this patient including pre-visit review of records, face to face time with the patient discussing her conditions and treatments and post visit orders.  Jaynie Collins, MD, FACOG Obstetrician & Gynecologist, [facility] for [employer], [facility]

## 2022-09-09 NOTE — Progress Notes (Signed)
Patient informed that the ultrasound is considered a limited obstetric ultrasound and is not intended to be a complete ultrasound exam.  Patient also informed that the ultrasound is not being completed with the intent of assessing for fetal or placental anomalies or any pelvic abnormalities. Explained that the purpose of today's ultrasound is to assess for fetal heart rate.  Patient acknowledges the purpose of the exam and the limitations of the study.         

## 2022-09-10 ENCOUNTER — Encounter: Payer: Self-pay | Admitting: Obstetrics & Gynecology

## 2022-09-10 LAB — CBC
Hematocrit: 40.4 % (ref 34.0–46.6)
Hemoglobin: 13.5 g/dL (ref 11.1–15.9)
MCH: 29.3 pg (ref 26.6–33.0)
MCHC: 33.4 g/dL (ref 31.5–35.7)
MCV: 88 fL (ref 79–97)
Platelets: 277 10*3/uL (ref 150–450)
RBC: 4.6 x10E6/uL (ref 3.77–5.28)
RDW: 13 % (ref 11.7–15.4)
WBC: 9.8 10*3/uL (ref 3.4–10.8)

## 2022-09-19 ENCOUNTER — Encounter: Payer: BC Managed Care – PPO | Admitting: Obstetrics & Gynecology

## 2022-09-25 ENCOUNTER — Encounter: Payer: Self-pay | Admitting: Obstetrics & Gynecology

## 2022-10-02 ENCOUNTER — Other Ambulatory Visit: Payer: Self-pay | Admitting: Obstetrics & Gynecology

## 2022-10-02 DIAGNOSIS — F32A Anxiety disorder, unspecified: Secondary | ICD-10-CM

## 2022-10-24 ENCOUNTER — Encounter: Payer: Self-pay | Admitting: *Deleted

## 2022-10-25 ENCOUNTER — Ambulatory Visit: Payer: BC Managed Care – PPO | Admitting: Family Medicine

## 2022-10-25 ENCOUNTER — Encounter: Payer: Self-pay | Admitting: Family Medicine

## 2022-10-25 VITALS — BP 134/90 | HR 102 | Wt 253.4 lb

## 2022-10-25 DIAGNOSIS — Z3A01 Less than 8 weeks gestation of pregnancy: Secondary | ICD-10-CM | POA: Diagnosis not present

## 2022-10-25 DIAGNOSIS — O039 Complete or unspecified spontaneous abortion without complication: Secondary | ICD-10-CM | POA: Diagnosis not present

## 2022-10-25 DIAGNOSIS — R030 Elevated blood-pressure reading, without diagnosis of hypertension: Secondary | ICD-10-CM

## 2022-10-25 NOTE — Progress Notes (Addendum)
   MISCARRIAGE/DC follow up  Subjective:   Tami Swanson is a 32 y.o. [phone] female here for f/u SAB diagnosed on 09/09/22. Took cytotec with good effect. Has not had menses yet. She reports taking UPT at home 7/27 that was negative. Denies continued bleeding/ cramping. Reports emotionally OK- found support with a friend the experienced a miscarriage and also her faith.  The following portions of the patient's history were reviewed and updated as appropriate: allergies, current medications, past family history, past medical history, past social history, past surgical history and problem list.  Review of Systems Pertinent items are noted in HPI.   Objective:  BP (!) 134/90   Pulse (!) 102   Wt 253 lb 6.4 oz (114.9 kg)   Breastfeeding Unknown   BMI 40.90 kg/m  Gen: well appearing, NAD HEENT: no scleral icterus CV: RR Lung: Normal WOB Ext: warm well perfused  Lab Results  Component Value Date   ABORH B POS 06/07/2018   ABORH  06/07/2018    B POS Performed at [facility], [address] [place], [place] [address]      Assessment and Plan:  1. Miscarriage within last 12 months - Missed AB or incomplete AB? Yes - used cytotec and had negative UPT. - reviewed preconception health  - reviewed contraception and basic fertility. Patient desires pregnancy again soon and counseled on starting to try. Recommended UPT in Early October if no menses.  - provided resources and support regarding pregnancy loss.   2. Elevated BP - Monitor at follow visits and low threshold for PCP referral  Please refer to After Visit Summary for other counseling recommendations.   Federico Flake, MD, MPH, ABFM, [facility] Attending Physician Center for [facility]

## 2022-10-25 NOTE — Progress Notes (Signed)
CC: follow up SAB

## 2022-11-29 ENCOUNTER — Ambulatory Visit (INDEPENDENT_AMBULATORY_CARE_PROVIDER_SITE_OTHER): Payer: BC Managed Care – PPO | Admitting: Family Medicine

## 2022-11-29 ENCOUNTER — Encounter: Payer: Self-pay | Admitting: Family Medicine

## 2022-11-29 VITALS — BP 119/71 | HR 91 | Temp 97.8°F | Ht 66.0 in | Wt 249.4 lb

## 2022-11-29 DIAGNOSIS — F32A Depression, unspecified: Secondary | ICD-10-CM

## 2022-11-29 DIAGNOSIS — E282 Polycystic ovarian syndrome: Secondary | ICD-10-CM | POA: Insufficient documentation

## 2022-11-29 DIAGNOSIS — R519 Headache, unspecified: Secondary | ICD-10-CM | POA: Diagnosis not present

## 2022-11-29 DIAGNOSIS — Z6841 Body Mass Index (BMI) 40.0 and over, adult: Secondary | ICD-10-CM | POA: Diagnosis not present

## 2022-11-29 DIAGNOSIS — I10 Essential (primary) hypertension: Secondary | ICD-10-CM

## 2022-11-29 DIAGNOSIS — F419 Anxiety disorder, unspecified: Secondary | ICD-10-CM

## 2022-11-29 MED ORDER — MELOXICAM 15 MG PO TABS: 15.0000 mg | ORAL_TABLET | Freq: Every day | ORAL | 0 refills | Status: DC

## 2022-11-29 NOTE — Assessment & Plan Note (Signed)
Main symptom is hirsutism.  Overall symptoms are manageable.  She was previously on spironolactone for this however is no longer on this as she is interested in conceiving.  She will let us know if she would like to restart this at some point in the future.

## 2022-11-29 NOTE — Assessment & Plan Note (Signed)
No current headache.  Reassuring neurologic exam today.  Based on history sounds like she was having migraine headaches that were likely exacerbated due to her recent pregnancy.  Her symptoms have improved significantly over the last month or so.  Given that her symptoms have improved and reassuring exam today do not think we need to pursue further workup at this point.  She will let us know if she has any return in symptoms.  We discussed importance of adequate sleep, good hydration, stress management, etc.

## 2022-11-29 NOTE — Assessment & Plan Note (Signed)
BMI 40 with comorbidities.  She is down about 15 pounds over the last 3 months.  Congratulated patient on weight loss.  She is presently following with weight management clinic however has not seen them for the last year or so.  She is working on diet and exercise.  She will continue working on this.  We did briefly discuss GLP-1 agonist however insurance would not pay for this for her at the time being and she has had good success with lifestyle interventions thus far.  She will follow-up with me in a few months for CPE though if she does need pharmacologic assistance in the future if she would be a great candidate for GLP-1 agonist.

## 2022-11-29 NOTE — Assessment & Plan Note (Signed)
Overall symptoms are stable without medications.

## 2022-11-29 NOTE — Assessment & Plan Note (Signed)
Blood pressure at goal without meds.

## 2022-11-29 NOTE — Patient Instructions (Signed)
It was very nice to see you today!  I think you are having migraines.  Please let us know if you have any return in symptoms.  You have inflammation in your chest wall and tailbone.  Start meloxicam for the next 7 to 10 days.  Let us know if not improving.   Return in about 6 months (around 05/30/2023) for Annual Physical.   Take care, Dr Jimmey Ralph  PLEASE NOTE:  If you had any lab tests, please let us know if you have not heard back within a few days. You may see your results on mychart before we have a chance to review them but we will give you a call once they are reviewed by [place].   If we ordered any referrals today, please let us know if you have not heard from their office within the next week.   If you had any urgent prescriptions sent in today, please check with the pharmacy within an hour of our visit to make sure the prescription was transmitted appropriately.   Please try these tips to maintain a healthy lifestyle:  Eat at least 3 REAL meals and 1-2 snacks per day.  Aim for no more than 5 hours between eating.  If you eat breakfast, please do so within one hour of getting up.   Each meal should contain half fruits/vegetables, one quarter protein, and one quarter carbs (no bigger than a computer mouse)  Cut down on sweet beverages. This includes juice, soda, and sweet tea.   Drink at least 1 glass of water with each meal and aim for at least 8 glasses per day  Exercise at least 150 minutes every week.

## 2022-11-29 NOTE — Progress Notes (Signed)
Tami Swanson is a 32 y.o. female who presents today for an office visit.  She is a new patient.   Assessment/Plan:  New/Acute Problems: Chest Wall Discomfort No red flags.  Symptoms are worse with arm movement and reproducible with exam-likely musculoskeletal.  We discussed treatment options.  She is already using heating pad without much improvement.  Will try meloxicam 15 mg daily for the next 1 to 2 weeks.  She will let us know if not improving and we can consider referral to PT or sports medicine.  Tailbone Pain  No red flag.  No recent injuries to suggest fracture or other injury.  Potentially worsened due to prolonged sitting.  She is already using a tailbone cushion which is helping.  We started meloxicam as above which should help.  She will let us know if not proving and we can refer to PT or sports medicine.  Chronic Problems Addressed Today: Headache No current headache.  Reassuring neurologic exam today.  Based on history sounds like she was having migraine headaches that were likely exacerbated due to her recent pregnancy.  Her symptoms have improved significantly over the last month or so.  Given that her symptoms have improved and reassuring exam today do not think we need to pursue further workup at this point.  She will let us know if she has any return in symptoms.  We discussed importance of adequate sleep, good hydration, stress management, etc.  Morbid obesity (HCC) BMI 40 with comorbidities.  She is down about 15 pounds over the last 3 months.  Congratulated patient on weight loss.  She is presently following with weight management clinic however has not seen them for the last year or so.  She is working on diet and exercise.  She will continue working on this.  We did briefly discuss GLP-1 agonist however insurance would not pay for this for her at the time being and she has had good success with lifestyle interventions thus far.  She will follow-up with me in a few months  for CPE though if she does need pharmacologic assistance in the future if she would be a great candidate for GLP-1 agonist.  PCOS (polycystic ovarian syndrome) Main symptom is hirsutism.  Overall symptoms are manageable.  She was previously on spironolactone for this however is no longer on this as she is interested in conceiving.  She will let us know if she would like to restart this at some point in the future.  Anxiety and depression Overall symptoms are stable without medications.  Hypertension Blood pressure at goal without meds.  Preventative health care-she will come back soon for CPE with labs.    Subjective:  HPI:  Patient here today to establish care as a new patient.  See A/P for status of chronic conditions.  She has a few issues that she would like to discuss as well.  A few months ago she was having severe right sided headaches.  Described as throbbing in nature.  Located primarily on right side of her head.  Occasionally with some blurry vision.  Some floaters in her vision as well.  No watery eyes or rhinorrhea however did notice that light and noise made her symptoms much worse.  Symptoms improved after taking Tylenol.  She also noted that symptoms improved after taking caffeine.  She was pregnant around this time as well and is not sure if this is contributing.  Symptoms gradually improved and she is back to near baseline.  She does still get occasional mild headache but this is much more manageable over the last month or so.  No weakness or numbness.  No nausea or vomiting.  She also has had intermittent chest discomfort for the last couple of months.  Located on top part of her chest and radiates into her left neck.  No obvious injuries or precipitating events.  Tried heating pad without much improvement.  Pain is worse with certain motions such as rotating her head and shoulders backwards.  No shortness of breath.  No nausea or vomiting as above.  Symptoms are overall  stable.  Over the last week or so she has also noticed more pain in her tailbone.  This gets worse after standing up after sitting for a long time.  Symptoms are much worse when at home when she does not have a tailbone cushion.  No obvious injuries participating events.  She has having right sided headaches daily. This was going on for several weeks. Ended about a week.        Objective:  Physical Exam: BP 119/71   Pulse 91   Temp 97.8 F (36.6 C) (Temporal)   Ht 5\' 6"  (1.676 m)   Wt 249 lb 6.4 oz (113.1 kg)   SpO2 97%   BMI 40.25 kg/m   Gen: No acute distress, resting comfortably CV: Regular rate and rhythm with no murmurs appreciated Pulm: Normal work of breathing, clear to auscultation bilaterally with no crackles, wheezes, or rhonchi MUSCULOSKELETAL:  - Chest Wall: No deformities.  Pain reproduced with arm flexion and abduction. - Back: No deformities.  Nontender to palpation. Neuro: Cranial nerves II through XII intact.  Finger-nose-finger testing intact bilaterally.  Strength 5 out of 5 in upper and lower extremities.  Sensation to light touch intact throughout. Psych: Normal affect and thought content      Diogo Anne M. Jimmey Ralph, MD 11/29/2022 2:46 PM

## 2022-12-26 ENCOUNTER — Other Ambulatory Visit: Payer: Self-pay | Admitting: Family Medicine

## 2023-01-25 ENCOUNTER — Encounter: Payer: Self-pay | Admitting: Family Medicine

## 2023-01-27 NOTE — Telephone Encounter (Signed)
Please advise 

## 2023-01-27 NOTE — Telephone Encounter (Signed)
I am happy to restart this but can we clarify what dose she was on? If she is been off for a while recommend we restart 20 mg daily and have her follow-up with [place] in a couple of weeks.  Tami Swanson. Jimmey Ralph, MD 01/27/2023 1:03 PM

## 2023-01-29 ENCOUNTER — Other Ambulatory Visit: Payer: Self-pay | Admitting: *Deleted

## 2023-01-29 DIAGNOSIS — F32A Depression, unspecified: Secondary | ICD-10-CM

## 2023-01-29 MED ORDER — CITALOPRAM HYDROBROMIDE 20 MG PO TABS
20.0000 mg | ORAL_TABLET | Freq: Every day | ORAL | 1 refills | Status: DC
Start: 2023-01-29 — End: 2023-02-24

## 2023-01-29 NOTE — Telephone Encounter (Signed)
Spoke with patient stated was taking citalopram 20mg  daily  Rx send to [facility]  F/U appointment schedule for 02/24/2023

## 2023-02-24 ENCOUNTER — Encounter: Payer: Self-pay | Admitting: Family Medicine

## 2023-02-24 ENCOUNTER — Ambulatory Visit (INDEPENDENT_AMBULATORY_CARE_PROVIDER_SITE_OTHER): Payer: BC Managed Care – PPO | Admitting: Family Medicine

## 2023-02-24 VITALS — BP 135/84 | HR 68 | Temp 98.1°F | Ht 66.0 in

## 2023-02-24 DIAGNOSIS — Z6841 Body Mass Index (BMI) 40.0 and over, adult: Secondary | ICD-10-CM

## 2023-02-24 DIAGNOSIS — F419 Anxiety disorder, unspecified: Secondary | ICD-10-CM

## 2023-02-24 DIAGNOSIS — I10 Essential (primary) hypertension: Secondary | ICD-10-CM | POA: Diagnosis not present

## 2023-02-24 DIAGNOSIS — F32A Depression, unspecified: Secondary | ICD-10-CM

## 2023-02-24 MED ORDER — CITALOPRAM HYDROBROMIDE 20 MG PO TABS: 20.0000 mg | ORAL_TABLET | Freq: Every day | ORAL | 3 refills | Status: DC

## 2023-02-24 NOTE — Assessment & Plan Note (Signed)
Blood pressure at goal without medications.

## 2023-02-24 NOTE — Progress Notes (Signed)
   Tami Swanson is a 32 y.o. female who presents today for an office visit.  Assessment/Plan:  Chronic Problems Addressed Today: Anxiety and depression Doing much better now back on Celexa.  She is tolerating well.  Will continue 20 mg daily.  She will come back in a few months for CPE.  We did discuss staying on this indefinitely versus trial off after 6 to 12 months versus using as needed seasonally.  She is not sure what she would like to do at this point though we can discuss more at next office visit.  We will send in enough medications so that she has a 1 year supply.  Morbid obesity (HCC) Patient working on lifestyle modifications.  She can check with insurance to see if they will pay for GLP-1 agonist.  Will discuss again at upcoming CPE.  Hypertension Blood pressure at goal without medications.      Subjective:  HPI:  See A/P for status of chronic conditions.  Patient is here today for follow-up.  I saw her about 3 months ago for initial visit.  Since her last visit we did refill her citalopram 20 mg daily.  At our last visit she had been off for awhile. Her irritability did worsen several weeks ago and we restarted on celexa 20 mg daily.  She has done very well with this.  No significant side effects.       Objective:  Physical Exam: BP 135/84   Pulse 68   Temp 98.1 F (36.7 C) (Temporal)   Ht 5\' 6"  (1.676 m)   LMP 02/14/2023   SpO2 98%   BMI 40.25 kg/m    Gen: No acute distress, resting comfortably Neuro: Grossly normal, moves all extremities Psych: Normal affect and thought content      Renardo Cheatum M. Jimmey Ralph, MD 02/24/2023 1:48 PM

## 2023-02-24 NOTE — Patient Instructions (Signed)
It was very nice to see you today!  I am glad that you are doing well.  We will continue your Celexa.  Please continue to work on diet and exercise.  Return for Annual Physical.   Take care, Dr Jimmey Ralph  PLEASE NOTE:  If you had any lab tests, please let us know if you have not heard back within a few days. You may see your results on mychart before we have a chance to review them but we will give you a call once they are reviewed by [place].   If we ordered any referrals today, please let us know if you have not heard from their office within the next week.   If you had any urgent prescriptions sent in today, please check with the pharmacy within an hour of our visit to make sure the prescription was transmitted appropriately.   Please try these tips to maintain a healthy lifestyle:  Eat at least 3 REAL meals and 1-2 snacks per day.  Aim for no more than 5 hours between eating.  If you eat breakfast, please do so within one hour of getting up.   Each meal should contain half fruits/vegetables, one quarter protein, and one quarter carbs (no bigger than a computer mouse)  Cut down on sweet beverages. This includes juice, soda, and sweet tea.   Drink at least 1 glass of water with each meal and aim for at least 8 glasses per day  Exercise at least 150 minutes every week.

## 2023-02-24 NOTE — Assessment & Plan Note (Signed)
Doing much better now back on Celexa.  She is tolerating well.  Will continue 20 mg daily.  She will come back in a few months for CPE.  We did discuss staying on this indefinitely versus trial off after 6 to 12 months versus using as needed seasonally.  She is not sure what she would like to do at this point though we can discuss more at next office visit.  We will send in enough medications so that she has a 1 year supply.

## 2023-02-24 NOTE — Assessment & Plan Note (Addendum)
Patient working on lifestyle modifications.  She can check with insurance to see if they will pay for GLP-1 agonist.  Will discuss again at upcoming CPE.

## 2023-03-14 ENCOUNTER — Encounter: Payer: Self-pay | Admitting: Family Medicine

## 2023-03-14 ENCOUNTER — Ambulatory Visit (INDEPENDENT_AMBULATORY_CARE_PROVIDER_SITE_OTHER): Payer: BC Managed Care – PPO | Admitting: Family Medicine

## 2023-03-14 VITALS — BP 125/87 | HR 72 | Temp 98.2°F | Ht 66.0 in | Wt 246.0 lb

## 2023-03-14 DIAGNOSIS — R5383 Other fatigue: Secondary | ICD-10-CM | POA: Diagnosis not present

## 2023-03-14 DIAGNOSIS — Z13220 Encounter for screening for lipoid disorders: Secondary | ICD-10-CM | POA: Diagnosis not present

## 2023-03-14 DIAGNOSIS — F419 Anxiety disorder, unspecified: Secondary | ICD-10-CM | POA: Diagnosis not present

## 2023-03-14 DIAGNOSIS — I10 Essential (primary) hypertension: Secondary | ICD-10-CM

## 2023-03-14 DIAGNOSIS — Z0001 Encounter for general adult medical examination with abnormal findings: Secondary | ICD-10-CM | POA: Diagnosis not present

## 2023-03-14 DIAGNOSIS — F32A Depression, unspecified: Secondary | ICD-10-CM

## 2023-03-14 DIAGNOSIS — Z131 Encounter for screening for diabetes mellitus: Secondary | ICD-10-CM

## 2023-03-14 LAB — COMPREHENSIVE METABOLIC PANEL
ALT: 19 U/L (ref 0–35)
AST: 16 U/L (ref 0–37)
Albumin: 4.2 g/dL (ref 3.5–5.2)
Alkaline Phosphatase: 69 U/L (ref 39–117)
BUN: 11 mg/dL (ref 6–23)
CO2: 27 meq/L (ref 19–32)
Calcium: 9.1 mg/dL (ref 8.4–10.5)
Chloride: 106 meq/L (ref 96–112)
Creatinine, Ser: 0.74 mg/dL (ref 0.40–1.20)
GFR: 105.7 mL/min (ref >60.00)
Glucose, Bld: 92 mg/dL (ref 70–99)
Potassium: 4.6 meq/L (ref 3.5–5.1)
Sodium: 138 meq/L (ref 135–145)
Total Bilirubin: 0.4 mg/dL (ref 0.2–1.2)
Total Protein: 6.7 g/dL (ref 6.0–8.3)

## 2023-03-14 LAB — TSH: TSH: 1.8 u[IU]/mL (ref 0.35–5.50)

## 2023-03-14 LAB — CBC
HCT: 40.9 % (ref 36.0–46.0)
Hemoglobin: 13.3 g/dL (ref 12.0–15.0)
MCHC: 32.5 g/dL (ref 30.0–36.0)
MCV: 88.6 fL (ref 78.0–100.0)
Platelets: 289 10*3/uL (ref 150.0–400.0)
RBC: 4.62 Mil/uL (ref 3.87–5.11)
RDW: 15.1 % (ref 11.5–15.5)
WBC: 5.5 10*3/uL (ref 4.0–10.5)

## 2023-03-14 LAB — LIPID PANEL
Cholesterol: 157 mg/dL (ref 0–200)
HDL: 47.7 mg/dL (ref >39.00)
LDL Cholesterol: 96 mg/dL (ref 0–99)
NonHDL: 109.75
Total CHOL/HDL Ratio: 3
Triglycerides: 69 mg/dL (ref 0.0–149.0)
VLDL: 13.8 mg/dL (ref 0.0–40.0)

## 2023-03-14 LAB — VITAMIN D 25 HYDROXY (VIT D DEFICIENCY, FRACTURES): VITD: 18.37 ng/mL — ABNORMAL LOW (ref 30.00–100.00)

## 2023-03-14 LAB — HEMOGLOBIN A1C: Hgb A1c MFr Bld: 5.4 % (ref 4.6–6.5)

## 2023-03-14 LAB — VITAMIN B12: Vitamin B-12: 297 pg/mL (ref 211–911)

## 2023-03-14 NOTE — Patient Instructions (Signed)
It was very nice to see you today!  We will check blood work today.  We may need to refer you for a sleep study depending on the results of your labs.  Please continue to work on diet and exercise.  I will see you back in a year for your next physical.  Please come back sooner if needed.  Return in about 1 year (around 03/13/2024) for Annual Physical.   Take care, Dr Jimmey Ralph  PLEASE NOTE:  If you had any lab tests, please let us know if you have not heard back within a few days. You may see your results on mychart before we have a chance to review them but we will give you a call once they are reviewed by [place].   If we ordered any referrals today, please let us know if you have not heard from their office within the next week.   If you had any urgent prescriptions sent in today, please check with the pharmacy within an hour of our visit to make sure the prescription was transmitted appropriately.   Please try these tips to maintain a healthy lifestyle:  Eat at least 3 REAL meals and 1-2 snacks per day.  Aim for no more than 5 hours between eating.  If you eat breakfast, please do so within one hour of getting up.   Each meal should contain half fruits/vegetables, one quarter protein, and one quarter carbs (no bigger than a computer mouse)  Cut down on sweet beverages. This includes juice, soda, and sweet tea.   Drink at least 1 glass of water with each meal and aim for at least 8 glasses per day  Exercise at least 150 minutes every week.    Preventive Care 61-38 Years Old, Female Preventive care refers to lifestyle choices and visits with your health care provider that can promote health and wellness. Preventive care visits are also called wellness exams. What can I expect for my preventive care visit? Counseling During your preventive care visit, your health care provider may ask about your: Medical history, including: Past medical problems. Family medical history. Pregnancy  history. Current health, including: Menstrual cycle. Method of birth control. Emotional well-being. Home life and relationship well-being. Sexual activity and sexual health. Lifestyle, including: Alcohol, nicotine or tobacco, and drug use. Access to firearms. Diet, exercise, and sleep habits. Work and work Astronomer. Sunscreen use. Safety issues such as seatbelt and bike helmet use. Physical exam Your health care provider may check your: Height and weight. These may be used to calculate your BMI (body mass index). BMI is a measurement that tells if you are at a healthy weight. Waist circumference. This measures the distance around your waistline. This measurement also tells if you are at a healthy weight and may help predict your risk of certain diseases, such as type 2 diabetes and high blood pressure. Heart rate and blood pressure. Body temperature. Skin for abnormal spots. What immunizations do I need?  Vaccines are usually given at various ages, according to a schedule. Your health care provider will recommend vaccines for you based on your age, medical history, and lifestyle or other factors, such as travel or where you work. What tests do I need? Screening Your health care provider may recommend screening tests for certain conditions. This may include: Pelvic exam and Pap test. Lipid and cholesterol levels. Diabetes screening. This is done by checking your blood sugar (glucose) after you have not eaten for a while (fasting). Hepatitis B test.  Hepatitis C test. HIV (human immunodeficiency virus) test. STI (sexually transmitted infection) testing, if you are at risk. BRCA-related cancer screening. This may be done if you have a family history of breast, ovarian, tubal, or peritoneal cancers. Talk with your health care provider about your test results, treatment options, and if necessary, the need for more tests. Follow these instructions at home: Eating and drinking  Eat  a healthy diet that includes fresh fruits and vegetables, whole grains, lean protein, and low-fat dairy products. Take vitamin and mineral supplements as recommended by your health care provider. Do not drink alcohol if: Your health care provider tells you not to drink. You are pregnant, may be pregnant, or are planning to become pregnant. If you drink alcohol: Limit how much you have to 0-1 drink a day. Know how much alcohol is in your drink. In the [place] one drink equals one 12 oz bottle of beer (355 mL), one 5 oz glass of wine (148 mL), or one 1 oz glass of hard liquor (44 mL). Lifestyle Brush your teeth every morning and night with fluoride toothpaste. Floss one time each day. Exercise for at least 30 minutes 5 or more days each week. Do not use any products that contain nicotine or tobacco. These products include cigarettes, chewing tobacco, and vaping devices, such as e-cigarettes. If you need help quitting, ask your health care provider. Do not use drugs. If you are sexually active, practice safe sex. Use a condom or other form of protection to prevent STIs. If you do not wish to become pregnant, use a form of birth control. If you plan to become pregnant, see your health care provider for a prepregnancy visit. Find healthy ways to manage stress, such as: Meditation, yoga, or listening to music. Journaling. Talking to a trusted person. Spending time with friends and family. Minimize exposure to UV radiation to reduce your risk of skin cancer. Safety Always wear your seat belt while driving or riding in a vehicle. Do not drive: If you have been drinking alcohol. Do not ride with someone who has been drinking. If you have been using any mind-altering substances or drugs. While texting. When you are tired or distracted. Wear a helmet and other protective equipment during sports activities. If you have firearms in your house, make sure you follow all gun safety procedures. Seek help  if you have been physically or sexually abused. What's next? Go to your health care provider once a year for an annual wellness visit. Ask your health care provider how often you should have your eyes and teeth checked. Stay up to date on all vaccines. This information is not intended to replace advice given to you by your health care provider. Make sure you discuss any questions you have with your health care provider. Document Revised: 08/09/2020 Document Reviewed: 08/09/2020 Elsevier Patient Education  2024 [employer].

## 2023-03-14 NOTE — Assessment & Plan Note (Signed)
Doing well on Celexa 20 mg daily.  She feels like this is a good dose.

## 2023-03-14 NOTE — Assessment & Plan Note (Signed)
Blood pressure at goal today without meds. 

## 2023-03-14 NOTE — Progress Notes (Signed)
Chief Complaint:  Tami Swanson is a 33 y.o. female who presents today for her annual comprehensive physical exam.    Assessment/Plan:  New/Acute Problems: Other Fatigue  Broad differential.  Will check labs today including CBC, c-Met, TSH, A1c, B12, vitamin D, and lipid panel.  She does have occasional snoring and disrupted sleep.  May have underlying OSA.  Would consider referral for sleep study if labs are normal and symptoms persist.  Chronic Problems Addressed Today: Anxiety and depression Doing well on Celexa 20 mg daily.  She feels like this is a good dose.  Hypertension Blood pressure at goal today without meds.  Preventative Healthcare: Check labs.  Up-to-date on vaccines.  Follows with gynecology for women's health.  Up-to-date on Pap.  Patient Counseling(The following topics were reviewed and/or handout was given):  -Nutrition: Stressed importance of moderation in sodium/caffeine intake, saturated fat and cholesterol, caloric balance, sufficient intake of fresh fruits, vegetables, and fiber.  -Stressed the importance of regular exercise.   -Substance Abuse: Discussed cessation/primary prevention of tobacco, alcohol, or other drug use; driving or other dangerous activities under the influence; availability of treatment for abuse.   -Injury prevention: Discussed safety belts, safety helmets, smoke detector, smoking near bedding or upholstery.   -Sexuality: Discussed sexually transmitted diseases, partner selection, use of condoms, avoidance of unintended pregnancy and contraceptive alternatives.   -Dental health: Discussed importance of regular tooth brushing, flossing, and dental visits.  -Health maintenance and immunizations reviewed. Please refer to Health maintenance section.  Return to care in 1 year for next preventative visit.     Subjective:  HPI:  She has no acute complaints today. Patient is here today for annual physical. We did see her a couple of weeks ago.   She is doing well at that time on Celexa 20 mg daily.  She is feeling a little more "foggy." She is getting about 7-9 hours of sleep at night. She does wake up frequently at night. Husband does note that she snores at night.   Lifestyle Diet: Balanced. Plenty if fruits and vegetables. . Trying to drink more water.  Exercise: None specific.  Check blood work     02/24/2023    1:16 PM  Depression screen PHQ 2/9  Decreased Interest 0  Down, Depressed, Hopeless 0  PHQ - 2 Score 0  Altered sleeping 2  Tired, decreased energy 2  Change in appetite 0  Feeling bad or failure about yourself  0  Trouble concentrating 1  Moving slowly or fidgety/restless 0  Suicidal thoughts 0  PHQ-9 Score 5  Difficult doing work/chores Somewhat difficult    There are no preventive care reminders to display for this patient.   ROS: Per HPI, otherwise a complete review of systems was negative.   PMH:  The following were reviewed and entered/updated in epic: Past Medical History:  Diagnosis Date   Anxiety    Back pain    Depression    Hypertension    Joint pain    PCOS (polycystic ovarian syndrome)    SOB (shortness of breath)    Patient Active Problem List   Diagnosis Date Noted   PCOS (polycystic ovarian syndrome) 11/29/2022   Headache 11/29/2022   Morbid obesity (HCC) 11/29/2022   Anxiety and depression 10/07/2019   Hypertension    Past Surgical History:  Procedure Laterality Date   CESAREAN SECTION N/A 06/10/2018   Procedure: CESAREAN SECTION;  Surgeon: Kathrynn Running, MD;  Location: [facility];  Service: Obstetrics;  Laterality: N/A;   FOOT SURGERY Right 2006    Family History  Problem Relation Age of Onset   Hypertension Mother    Anxiety disorder Mother    Hypertension Father    Hyperlipidemia Father    Heart disease Maternal Grandfather     Medications- reviewed and updated Current Outpatient Medications  Medication Sig Dispense Refill   citalopram (CELEXA) 20 MG  tablet Take 1 tablet (20 mg total) by mouth daily. 90 tablet 3   Prenatal Multivit-Min-Fe-FA (PRE-NATAL FORMULA PO) Take by mouth.     vitamin C (ASCORBIC ACID) 250 MG tablet Take 250 mg by mouth daily.     VITAMIN D, CHOLECALCIFEROL, PO Take by mouth.     No current facility-administered medications for this visit.    Allergies-reviewed and updated No Known Allergies  Social History   Socioeconomic History   Marital status: Single    Spouse name: Not on file   Number of children: Not on file   Years of education: Not on file   Highest education level: Some college, no degree  Occupational History   Occupation: Market researcher  Tobacco Use   Smoking status: Former    Current packs/day: 0.25    Types: Cigarettes   Smokeless tobacco: Former   Tobacco comments:    Only smokes one cigarette a day  Vaping Use   Vaping status: Some Days  Substance and Sexual Activity   Alcohol use: Not Currently   Drug use: No   Sexual activity: Yes    Partners: Male    Birth control/protection: None  Other Topics Concern   Not on file  Social History Narrative   Nursing student at [employer]   Social Drivers of Health   Financial Resource Strain: Low Risk  (02/23/2023)   Overall Financial Resource Strain (CARDIA)    Difficulty of Paying Living Expenses: Not very hard  Food Insecurity: Food Insecurity Present (02/23/2023)   Hunger Vital Sign    Worried About Running Out of Food in the Last Year: Sometimes true    Ran Out of Food in the Last Year: Never true  Transportation Needs: No Transportation Needs (02/23/2023)   PRAPARE - Administrator, Civil Service (Medical): No    Lack of Transportation (Non-Medical): No  Physical Activity: Sufficiently Active (02/23/2023)   Exercise Vital Sign    Days of Exercise per Week: 5 days    Minutes of Exercise per Session: 30 min  Stress: Stress Concern Present (02/23/2023)   [employer] of Occupational Health - Occupational  Stress Questionnaire    Feeling of Stress : Rather much  Social Connections: Moderately Isolated (02/23/2023)   Social Connection and Isolation Panel [NHANES]    Frequency of Communication with Friends and Family: More than three times a week    Frequency of Social Gatherings with Friends and Family: Once a week    Attends Religious Services: Never    Database administrator or Organizations: No    Attends Engineer, structural: Not on file    Marital Status: Living with partner        Objective:  Physical Exam: BP 125/87   Pulse 72   Temp 98.2 F (36.8 C) (Temporal)   Ht 5\' 6"  (1.676 m)   Wt 246 lb (111.6 kg)   LMP 02/14/2023   BMI 39.71 kg/m   Body mass index is 39.71 kg/m. Wt Readings from Last 3 Encounters:  03/14/23 246 lb (111.6 kg)  11/29/22  249 lb 6.4 oz (113.1 kg)  10/25/22 253 lb 6.4 oz (114.9 kg)   Gen: NAD, resting comfortably HEENT: TMs normal bilaterally. OP clear. No thyromegaly noted.  CV: RRR with no murmurs appreciated Pulm: NWOB, CTAB with no crackles, wheezes, or rhonchi GI: Normal bowel sounds present. Soft, Nontender, Nondistended. MSK: no edema, cyanosis, or clubbing noted Skin: warm, dry Neuro: CN2-12 grossly intact. Strength 5/5 in upper and lower extremities. Reflexes symmetric and intact bilaterally.  Psych: Normal affect and thought content     Aleric Froelich M. Jimmey Ralph, MD 03/14/2023 9:03 AM

## 2023-03-18 ENCOUNTER — Encounter: Payer: Self-pay | Admitting: Family Medicine

## 2023-03-18 NOTE — Progress Notes (Signed)
Vitamin D is a little bit low.  The rest of her labs are all at goal.  The vitamin D may be contributing some to her fatigue issues.  Recommend we start vitamin D supplementation 50,000 IUs once weekly.  We should recheck in 3 to 6 months.  We can recheck everything in a year.

## 2023-03-19 ENCOUNTER — Other Ambulatory Visit: Payer: Self-pay | Admitting: *Deleted

## 2023-03-19 MED ORDER — VITAMIN D (ERGOCALCIFEROL) 1.25 MG (50000 UNIT) PO CAPS: 50000.0000 [IU] | ORAL_CAPSULE | ORAL | 0 refills | Status: DC

## 2023-05-30 ENCOUNTER — Encounter: Payer: BC Managed Care – PPO | Admitting: Family Medicine

## 2023-05-30 ENCOUNTER — Encounter: Payer: Self-pay | Admitting: Family Medicine

## 2023-05-30 DIAGNOSIS — E282 Polycystic ovarian syndrome: Secondary | ICD-10-CM

## 2023-05-30 DIAGNOSIS — Z3169 Encounter for other general counseling and advice on procreation: Secondary | ICD-10-CM

## 2023-06-13 ENCOUNTER — Other Ambulatory Visit

## 2023-06-20 ENCOUNTER — Other Ambulatory Visit

## 2023-06-20 DIAGNOSIS — Z3169 Encounter for other general counseling and advice on procreation: Secondary | ICD-10-CM | POA: Diagnosis not present

## 2023-06-20 DIAGNOSIS — E282 Polycystic ovarian syndrome: Secondary | ICD-10-CM | POA: Diagnosis not present

## 2023-06-29 LAB — TSH RFX ON ABNORMAL TO FREE T4: TSH: 2.46 u[IU]/mL (ref 0.450–4.500)

## 2023-06-29 LAB — ANTI MULLERIAN HORMONE: ANTI-MULLERIAN HORMONE (AMH): 2.14 ng/mL

## 2023-06-30 ENCOUNTER — Encounter: Payer: Self-pay | Admitting: Family Medicine

## 2023-07-28 ENCOUNTER — Telehealth: Admitting: Family Medicine

## 2023-07-28 ENCOUNTER — Encounter: Payer: Self-pay | Admitting: Family Medicine

## 2023-07-28 DIAGNOSIS — E282 Polycystic ovarian syndrome: Secondary | ICD-10-CM

## 2023-07-28 MED ORDER — LETROZOLE 2.5 MG PO TABS: 2.5000 mg | ORAL_TABLET | Freq: Every day | ORAL | 2 refills | Status: DC

## 2023-07-28 NOTE — Progress Notes (Signed)
 GYNECOLOGY VIRTUAL VISIT ENCOUNTER NOTE  Provider location: Center for [facility]   Patient location: work  I connected with Tami Swanson on 07/28/23 at  1:50 PM EDT by MyChart Video Encounter and verified that I am speaking with the correct person using two identifiers.   I discussed the limitations, risks, security and privacy concerns of performing an evaluation and management service virtually and the availability of in person appointments. I also discussed with the patient that there may be a patient responsible charge related to this service. The patient expressed understanding and agreed to proceed.   History:  Tami Swanson is a 33 y.o. G53P1001 female being evaluated today for fertility  Every 27 day cycle, last 5-6 days.  Typically heaviest day 2.  Currently using app to help track-- Flo App to predict  Inputting cycle and symptoms to predict fertile window Reports sex every 2-3 days during fertile window No positive pregnancy tests since miscarriage in August 2024 Partner has not checked sperm count but they have the kit  LMP 07/20/23  Has upcoming trip to [place]  with only partner.   She denies any abnormal vaginal discharge, bleeding, pelvic pain or other concerns.       Past Medical History:  Diagnosis Date   Anxiety    Back pain    Depression    Hypertension    Joint pain    PCOS (polycystic ovarian syndrome)    SOB (shortness of breath)    Past Surgical History:  Procedure Laterality Date   CESAREAN SECTION N/A 06/10/2018   Procedure: CESAREAN SECTION;  Surgeon: Janeane Mealy, MD;  Location: [facility] LD ORS;  Service: Obstetrics;  Laterality: N/A;   FOOT SURGERY Right 2006   The following portions of the patient's history were reviewed and updated as appropriate: allergies, current medications, past family history, past medical history, past social history, past surgical history and problem list.   Health Maintenance:  Normal pap and  negative HRHPV 2023- repeat in 2027  Review of Systems:  Pertinent items noted in HPI and remainder of comprehensive ROS otherwise negative.  Physical Exam:   General:  Alert, oriented and cooperative. Patient appears to be in no acute distress.  Mental Status: Normal mood and affect. Normal behavior. Normal judgment and thought content.   Respiratory: Normal respiratory effort, no problems with respiration noted  Rest of physical exam deferred due to type of encounter  Labs and Imaging [facility] in April 2.14 (lower than average which is 3)    Assessment and Plan:     1. PCOS (polycystic ovarian syndrome) (Primary) Currently having regular menses AMH showed likely to respond to ovarian stimulation Reviewed trial of letrozole Discussed risk of multiple fetal gestation Strongly encouraged sperm count Reviewed 3 month trial and possible referral after this to REI vs more immediate if sperm count is low.  - letrozole (FEMARA) 2.5 MG tablet; Take 1 tablet (2.5 mg total) by mouth daily. Take on days 3 to 7 following a spontaneous menses or progestin-induced bleed.  Dispense: 5 tablet; Refill: 2       I discussed the assessment and treatment plan with the patient. The patient was provided an opportunity to ask questions and all were answered. The patient agreed with the plan and demonstrated an understanding of the instructions.   The patient was advised to call back or seek an in-person evaluation/go to the ED if the symptoms worsen or if the condition fails to improve as  anticipated.  I provided 15 minutes of face-to-face time during this encounter. I also spent 10 minutes dedicated to the care of this patient including pre-visit review of records, post visit ordering of medications and appropriate tests or procedures, coordinating care and documenting this visit encounter.    Abner Ables, MD Center for [employer], [facility]

## 2023-09-10 ENCOUNTER — Encounter: Payer: Self-pay | Admitting: Family Medicine

## 2023-09-16 ENCOUNTER — Telehealth: Payer: Self-pay | Admitting: *Deleted

## 2023-09-16 NOTE — Telephone Encounter (Signed)
 Spoke with rep with [employer] & informed her we did receive the ROI

## 2023-09-16 NOTE — Telephone Encounter (Signed)
 Copied from CRM [id number]. Topic: Medical Record Request - Other >> Sep 15, 2023  2:30 PM Rosina BIRCH wrote: Reason for CRM: jen from [employer] called wanting to know if we received medical records request for the patient  CB [phone]

## 2023-10-14 ENCOUNTER — Encounter: Payer: Self-pay | Admitting: Family Medicine

## 2023-10-24 ENCOUNTER — Encounter: Payer: Self-pay | Admitting: Family Medicine

## 2023-10-24 MED ORDER — LETROZOLE 2.5 MG PO TABS: 5.0000 mg | ORAL_TABLET | Freq: Every day | ORAL | 4 refills | Status: DC

## 2023-11-24 ENCOUNTER — Other Ambulatory Visit

## 2023-11-24 DIAGNOSIS — E282 Polycystic ovarian syndrome: Secondary | ICD-10-CM

## 2023-11-24 DIAGNOSIS — O039 Complete or unspecified spontaneous abortion without complication: Secondary | ICD-10-CM | POA: Diagnosis not present

## 2023-11-26 ENCOUNTER — Ambulatory Visit: Payer: Self-pay | Admitting: Family Medicine

## 2023-11-26 LAB — CBC
Hematocrit: 34.8 % (ref 34.0–46.6)
Hemoglobin: 11.4 g/dL (ref 11.1–15.9)
MCH: 30.3 pg (ref 26.6–33.0)
MCHC: 32.8 g/dL (ref 31.5–35.7)
MCV: 93 fL (ref 79–97)
Platelets: 289 x10E3/uL (ref 150–450)
RBC: 3.76 x10E6/uL — ABNORMAL LOW (ref 3.77–5.28)
RDW: 12.5 % (ref 11.7–15.4)
WBC: 7.2 x10E3/uL (ref 3.4–10.8)

## 2023-11-26 LAB — LUPUS ANTICOAGULANT PANEL
Dilute Viper Venom Time: 31.5 s (ref 0.0–47.0)
PTT Lupus Anticoagulant: 30.7 s (ref 0.0–43.5)

## 2023-11-26 LAB — HEMOGLOBIN A1C
Est. average glucose Bld gHb Est-mCnc: 100 mg/dL
Hgb A1c MFr Bld: 5.1 % (ref 4.8–5.6)

## 2023-11-26 LAB — BETA HCG QUANT (REF LAB): hCG Quant: 201 m[IU]/mL

## 2023-11-26 LAB — VITAMIN D 25 HYDROXY (VIT D DEFICIENCY, FRACTURES): Vit D, 25-Hydroxy: 43.8 ng/mL (ref 30.0–100.0)

## 2023-11-26 LAB — TSH RFX ON ABNORMAL TO FREE T4: TSH: 1.91 u[IU]/mL (ref 0.450–4.500)

## 2023-12-01 ENCOUNTER — Other Ambulatory Visit

## 2023-12-01 DIAGNOSIS — O039 Complete or unspecified spontaneous abortion without complication: Secondary | ICD-10-CM | POA: Diagnosis not present

## 2023-12-02 ENCOUNTER — Ambulatory Visit: Payer: Self-pay | Admitting: Family Medicine

## 2023-12-02 LAB — BETA HCG QUANT (REF LAB): hCG Quant: 218 m[IU]/mL

## 2023-12-03 ENCOUNTER — Other Ambulatory Visit

## 2023-12-03 DIAGNOSIS — O039 Complete or unspecified spontaneous abortion without complication: Secondary | ICD-10-CM

## 2023-12-04 ENCOUNTER — Inpatient Hospital Stay (HOSPITAL_COMMUNITY)

## 2023-12-04 ENCOUNTER — Ambulatory Visit: Payer: Self-pay | Admitting: Family Medicine

## 2023-12-04 ENCOUNTER — Inpatient Hospital Stay (HOSPITAL_COMMUNITY)
Admission: AD | Admit: 2023-12-04 | Discharge: 2023-12-04 | Disposition: A | Discharge status: Home / Self Care | Attending: Obstetrics and Gynecology | Admitting: Obstetrics and Gynecology

## 2023-12-04 ENCOUNTER — Other Ambulatory Visit: Payer: Self-pay

## 2023-12-04 DIAGNOSIS — N938 Other specified abnormal uterine and vaginal bleeding: Secondary | ICD-10-CM | POA: Diagnosis not present

## 2023-12-04 DIAGNOSIS — Z3A00 Weeks of gestation of pregnancy not specified: Secondary | ICD-10-CM

## 2023-12-04 DIAGNOSIS — O0000 Abdominal pregnancy without intrauterine pregnancy: Secondary | ICD-10-CM | POA: Diagnosis not present

## 2023-12-04 DIAGNOSIS — O009 Unspecified ectopic pregnancy without intrauterine pregnancy: Secondary | ICD-10-CM | POA: Insufficient documentation

## 2023-12-04 DIAGNOSIS — O4690 Antepartum hemorrhage, unspecified, unspecified trimester: Secondary | ICD-10-CM | POA: Diagnosis not present

## 2023-12-04 DIAGNOSIS — O26859 Spotting complicating pregnancy, unspecified trimester: Secondary | ICD-10-CM | POA: Diagnosis not present

## 2023-12-04 LAB — WET PREP, GENITAL
Sperm: NONE SEEN
Trich, Wet Prep: NONE SEEN
WBC, Wet Prep HPF POC: >=10 — AB (ref <10)
Yeast Wet Prep HPF POC: NONE SEEN

## 2023-12-04 LAB — ABO/RH: ABO/RH(D): B POS

## 2023-12-04 LAB — COMPREHENSIVE METABOLIC PANEL WITH GFR
ALT: 19 U/L (ref 0–44)
AST: 17 U/L (ref 15–41)
Albumin: 3.7 g/dL (ref 3.5–5.0)
Alkaline Phosphatase: 66 U/L (ref 38–126)
Anion gap: 8 (ref 5–15)
BUN: 8 mg/dL (ref 6–20)
CO2: 24 mmol/L (ref 22–32)
Calcium: 8.9 mg/dL (ref 8.9–10.3)
Chloride: 102 mmol/L (ref 98–111)
Creatinine, Ser: 0.75 mg/dL (ref 0.44–1.00)
GFR, Estimated: >60 mL/min (ref >60)
Glucose, Bld: 96 mg/dL (ref 70–99)
Potassium: 3.8 mmol/L (ref 3.5–5.1)
Sodium: 134 mmol/L — ABNORMAL LOW (ref 135–145)
Total Bilirubin: 0.6 mg/dL (ref 0.0–1.2)
Total Protein: 6.8 g/dL (ref 6.5–8.1)

## 2023-12-04 LAB — CBC
HCT: 35.6 % — ABNORMAL LOW (ref 36.0–46.0)
Hemoglobin: 11.9 g/dL — ABNORMAL LOW (ref 12.0–15.0)
MCH: 29.8 pg (ref 26.0–34.0)
MCHC: 33.4 g/dL (ref 30.0–36.0)
MCV: 89 fL (ref 80.0–100.0)
Platelets: 328 K/uL (ref 150–400)
RBC: 4 MIL/uL (ref 3.87–5.11)
RDW: 12.6 % (ref 11.5–15.5)
WBC: 8.1 K/uL (ref 4.0–10.5)
nRBC: 0 % (ref 0.0–0.2)

## 2023-12-04 LAB — HCG, QUANTITATIVE, PREGNANCY: hCG, Beta Chain, Quant, S: 392 m[IU]/mL — ABNORMAL HIGH (ref <5)

## 2023-12-04 LAB — BETA HCG QUANT (REF LAB): hCG Quant: 270 m[IU]/mL

## 2023-12-04 MED ORDER — METHOTREXATE FOR ECTOPIC PREGNANCY
50.0000 mg/m2 | Freq: Once | INTRAMUSCULAR | Status: AC
  Administered 2023-12-04: 115 mg via INTRAMUSCULAR
  Filled 2023-12-04: qty 4.6

## 2023-12-04 MED ORDER — METRONIDAZOLE 500 MG PO TABS: 500.0000 mg | ORAL_TABLET | Freq: Two times a day (BID) | ORAL | 0 refills | Status: DC

## 2023-12-04 NOTE — Discharge Instructions (Signed)
 The risks of methotrexate were reviewed including failure requiring repeat dosing or eventual surgery. You expressed understanding that methotrexate involves frequent return visits to monitor lab values and that you remain at risk of ectopic rupture until your pregnancy hormone level returns to normal. You have opted to proceed with methotrexate.  We discussed side effects of photosensitivity & GI upset.  You should avoid direct sunlight and abstain from alcohol, NSAIDs and sexual intercourse for two weeks. If you are taking any multivitamins with folic acid you should discontinue them.  ?You should follow up on day 4 (Sunday) and day 7 (Wednesday) for repeat pregnancy hormone level checks.  They for repeat will need to be in the MAU because it is a weekend and day 7 has been scheduled at [facility] for women.  ?Call or return to the MAU with any abdominal pain, vomiting, fainting, or fever.

## 2023-12-04 NOTE — MAU Note (Signed)
 Tami Swanson is a 33 y.o. at Unknown here in MAU reporting: instructed by OB office to be seen in MAU for further evaluation secondary had miscarriage and HCG levels continue to rise.  Reports light pink VB with wiping and LLQ discomfort.  LMP: 10/14/2023 Onset of complaint: 11/14/2023 was miscarriage Pain score: 2 Vitals:   12/04/23 1053  BP: (!) 142/85  Pulse: 72  Resp: 20  Temp: 98.1 F (36.7 C)  SpO2: 99%     FHT: NA  Lab orders placed from triage: HCG

## 2023-12-04 NOTE — MAU Provider Note (Addendum)
 History     CSN: [id number]  Arrival date and time: 12/04/23 1003   None     Chief Complaint  Patient presents with   HCG Level   Ultrasound   HPI Patient is a 33 year old G2, P1 presenting after speaking with primary OB provider with concern for ectopic pregnancy.  She had vaginal bleeding and concern for miscarriage in September and I have been trending her hCG levels.  Initial hCG 201, repeat 10/6 with 218 and then repeat 2/8 was 270.  Reports a dull left-sided pain but nothing severe.  No significant bleeding at this time but does have spotting.  Intercourse 2 days ago.  No other symptoms.   OB History     Gravida  2   Para  1   Term  1   Preterm      AB      Living  1      SAB      IAB      Ectopic      Multiple  0   Live Births  1           Past Medical History:  Diagnosis Date   Anxiety    Back pain    Depression    Hypertension    Joint pain    PCOS (polycystic ovarian syndrome)    SOB (shortness of breath)     Past Surgical History:  Procedure Laterality Date   CESAREAN SECTION N/A 06/10/2018   Procedure: CESAREAN SECTION;  Surgeon: Kandis Devaughn Sayres, MD;  Location: [facility] LD [facility]S;  Service: Obstetrics;  Laterality: N/A;   FOOT SURGERY Right 2006    Family History  Problem Relation Age of Onset   Hypertension Mother    Anxiety disorder Mother    Hypertension Father    Hyperlipidemia Father    Heart disease Maternal Grandfather     Social History   Tobacco Use   Smoking status: Former    Current packs/day: 0.25    Types: Cigarettes   Smokeless tobacco: Former   Tobacco comments:    Only smokes one cigarette a day  Vaping Use   Vaping status: Some Days  Substance Use Topics   Alcohol use: Not Currently   Drug use: No    Allergies: No Known Allergies  Medications Prior to Admission  Medication Sig Dispense Refill Last Dose/Taking   citalopram  (CELEXA ) 20 MG tablet Take 1 tablet (20 mg total) by mouth daily. 90 tablet 3     letrozole  (FEMARA ) 2.5 MG tablet Take 2 tablets (5 mg total) by mouth daily. Take on days 3 to 7 following a spontaneous menses or progestin-induced bleed. 10 tablet 4    Prenatal Multivit-Min-Fe-FA (PRE-NATAL FORMULA PO) Take by mouth.      vitamin C (ASCORBIC ACID) 250 MG tablet Take 250 mg by mouth daily.      VITAMIN D , CHOLECALCIFEROL, PO Take by mouth.      Vitamin D , Ergocalciferol , (DRISDOL ) 1.25 MG (50000 UNIT) CAPS capsule Take 1 capsule (50,000 Units total) by mouth every 7 (seven) days. 12 capsule 0     Review of Systems  Gastrointestinal:  Positive for abdominal pain.  Genitourinary:  Positive for vaginal bleeding. Negative for vaginal discharge.  All other systems reviewed and are negative.  Physical Exam   Blood pressure (!) 142/85, pulse 72, temperature 98.1 F (36.7 C), temperature source Oral, resp. rate 20, height 5' 6 (1.676 m), weight 113.9 kg, last menstrual period 10/11/2023, SpO2  99%.  Physical Exam Vitals and nursing note reviewed.  Constitutional:      Appearance: Normal appearance.  HENT:     Head: Normocephalic and atraumatic.     Nose: No congestion or rhinorrhea.  Eyes:     Extraocular Movements: Extraocular movements intact.  Cardiovascular:     Rate and Rhythm: Normal rate.  Pulmonary:     Effort: Pulmonary effort is normal.  Abdominal:     Palpations: Abdomen is soft.     Tenderness: There is no abdominal tenderness.  Musculoskeletal:        General: Normal range of motion.     Cervical back: Normal range of motion.  Skin:    General: Skin is warm.     Capillary Refill: Capillary refill takes less than 2 seconds.  Neurological:     General: No focal deficit present.     Mental Status: She is alert.     Cranial Nerves: No cranial nerve deficit.  Psychiatric:        Mood and Affect: Mood normal.        Behavior: Behavior normal.     MAU Course  Procedures  MDM CBC CMP Type and screen Ultrasound Beta hCG  quant Methotrexate   Assessment and Plan  Lucinda Spells is a 33 year old G2, P1 presenting for concern for ectopic pregnancy.  Ectopic pregnancy Patient reports that she had what she thought was a miscarriage on 9/19.  She had lab work completed on 9/29 checking an hCG level and it came back at 201.  Repeat 1 week later on 10/6 showed 218.  A repeat 48 hours later on 10/8 was 270.  She was sent to the MAU for evaluation because of the inappropriate rise of hCG and concern for ectopic pregnancy.  She was having mild left lower quadrant pain.  Repeat hCG today 392.  Patient reports she was sexually active 2 days ago but had not otherwise been sexually active since her bleeding started on 9/19.  Given an appropriate rise we will treat ectopic pregnancy. - CBC within normal limits - CMP within normal limits - Beta-hCG as above - Methotrexate ordered, methotrexate counseling as below - Patient given instructions on follow-up and scheduled for day 4 and today 7 hCGs. - Strict MAU precautions given - No further questions or concerns  The risks of methotrexate were reviewed including failure requiring repeat dosing or eventual surgery. She understands that methotrexate involves frequent return visits to monitor lab values and that she remains at risk of ectopic rupture until her beta is less than assay. The patient opts to proceed with methotrexate. She has no history of hepatic or renal dysfunction, has  normal BUN/Cr/LFT's/platelets. She is felt to be reliable for follow-up. Side effects of photosensitivity & GI upset were discussed. She knows to avoid direct sunlight and abstain from alcohol, aspirin  and aspirin -like products for two weeks. She was counseled to discontinue any MVI with folic acid. She understands to follow up on D4 (Sunday) and D7 (Wednesday) for repeat BHCG and was given the instruction sheet. Strict ectopic precautions were reviewed, the patient knows to call with any abdominal pain,  vomiting, fainting, or any concerns with her health. Rh positive.   Bacterial vaginosis Wet prep positive for clue cells.  Prescription sent to patient's pharmacy for metronidazole .  Norleen LULLA Rover 12/04/2023, 12:39 PM

## 2023-12-05 LAB — GC/CHLAMYDIA PROBE AMP (~~LOC~~) NOT AT ARMC
Chlamydia: NEGATIVE
Comment: NEGATIVE
Comment: NORMAL
Neisseria Gonorrhea: NEGATIVE

## 2023-12-07 ENCOUNTER — Other Ambulatory Visit: Payer: Self-pay

## 2023-12-07 ENCOUNTER — Other Ambulatory Visit (HOSPITAL_COMMUNITY): Attending: Family Medicine

## 2023-12-07 ENCOUNTER — Encounter (HOSPITAL_COMMUNITY): Payer: Self-pay | Admitting: Obstetrics & Gynecology

## 2023-12-07 ENCOUNTER — Inpatient Hospital Stay (HOSPITAL_COMMUNITY)
Admission: AD | Admit: 2023-12-07 | Discharge: 2023-12-07 | Disposition: A | Discharge status: Home / Self Care | Payer: Self-pay | Attending: Obstetrics & Gynecology | Admitting: Obstetrics & Gynecology

## 2023-12-07 ENCOUNTER — Ambulatory Visit: Payer: Self-pay | Admitting: Student

## 2023-12-07 DIAGNOSIS — O009 Unspecified ectopic pregnancy without intrauterine pregnancy: Secondary | ICD-10-CM

## 2023-12-07 DIAGNOSIS — Z3A08 8 weeks gestation of pregnancy: Secondary | ICD-10-CM

## 2023-12-07 LAB — HCG, QUANTITATIVE, PREGNANCY: hCG, Beta Chain, Quant, S: 432 m[IU]/mL — ABNORMAL HIGH (ref <5)

## 2023-12-07 NOTE — MAU Provider Note (Signed)
 History   Chief Complaint:  Follow-up   Tami Swanson is a 33 y.o. G3P1011 at [redacted]w[redacted]d who presents for labs.  She is day 4 s/p methotrexate for ectopic pregnancy. Denies abdominal pain or vaginal bleeding.   Physical Exam   Blood pressure (!) 135/91, pulse 85, temperature 98.8 F (37.1 C), temperature source Oral, resp. rate 18, height 5' 6 (1.676 m), weight 113.2 kg, last menstrual period 10/11/2023, SpO2 100%.  Physical Examination: General appearance - alert, well appearing, and in no distress Mental status - alert, oriented to person, place, and time Eyes - pupils equal and reactive, extraocular eye movements intact, sclera anicteric Chest - normal respiratory effort  Labs: Results for orders placed or performed during the hospital encounter of 12/07/23 (from the past 24 hours)  hCG, quantitative, pregnancy   Collection Time: 12/07/23 10:15 AM  Result Value Ref Range   hCG, Beta Chain, Quant, S 432 (H) <5 mIU/mL    Ultrasound Studies:   No results found.  Assessment:   1. Ectopic pregnancy without intrauterine pregnancy, unspecified location     Component     Latest Ref Rng 12/04/2023 12/07/2023  HCG, Beta Chain, Quant, S     <5 mIU/mL 392 (H)  432 (H)       Plan: -Discharge home in stable condition -Ectopic precautions discussed -Patient advised to follow-up with [facility] on 10/15 for day 7 hcg -Patient may return to MAU as needed or if her condition were to change or worsen  Rocky Satterfield, NP 12/07/2023, 12:38 PM

## 2023-12-07 NOTE — MAU Note (Signed)
.  Tami Swanson is a 33 y.o. at [redacted]w[redacted]d here in MAU reporting: Reports follow up no new complaints   Pain score: denies  Vitals:   12/07/23 1025  BP: (!) 135/91  Pulse: 85  Resp: 18  Temp: 98.8 F (37.1 C)  SpO2: 100%      Lab orders placed from triage:   none

## 2023-12-10 ENCOUNTER — Encounter: Payer: Self-pay | Admitting: Obstetrics and Gynecology

## 2023-12-10 ENCOUNTER — Ambulatory Visit: Payer: Self-pay | Admitting: Obstetrics and Gynecology

## 2023-12-10 ENCOUNTER — Other Ambulatory Visit: Payer: Self-pay

## 2023-12-10 ENCOUNTER — Ambulatory Visit (INDEPENDENT_AMBULATORY_CARE_PROVIDER_SITE_OTHER): Admitting: *Deleted

## 2023-12-10 VITALS — BP 134/77 | HR 89 | Ht 66.0 in | Wt 249.5 lb

## 2023-12-10 DIAGNOSIS — Z3A01 Less than 8 weeks gestation of pregnancy: Secondary | ICD-10-CM

## 2023-12-10 DIAGNOSIS — O009 Unspecified ectopic pregnancy without intrauterine pregnancy: Secondary | ICD-10-CM

## 2023-12-10 LAB — BETA HCG QUANT (REF LAB): hCG Quant: 306 m[IU]/mL

## 2023-12-10 NOTE — Progress Notes (Signed)
 Pt presents for Tami Swanson #7 stat BHCG following methotrexate for ectopic pregnancy. She reports having mild abdominal discomfort - the same as 3 days ago. She is having occasional spotting - no different. Pt was advised to return to MAU if she develops heavy vaginal bleeding or increased abdominal pain. She will see results in Mychart today.  She will be contacted with next steps in care either via Mychart or by phone. Pt voiced understanding.

## 2023-12-17 ENCOUNTER — Other Ambulatory Visit

## 2023-12-17 DIAGNOSIS — O009 Unspecified ectopic pregnancy without intrauterine pregnancy: Secondary | ICD-10-CM

## 2023-12-18 ENCOUNTER — Ambulatory Visit: Payer: Self-pay | Admitting: Obstetrics & Gynecology

## 2023-12-18 LAB — BETA HCG QUANT (REF LAB): hCG Quant: 159 m[IU]/mL

## 2023-12-24 ENCOUNTER — Other Ambulatory Visit

## 2023-12-24 DIAGNOSIS — O009 Unspecified ectopic pregnancy without intrauterine pregnancy: Secondary | ICD-10-CM

## 2023-12-25 LAB — BETA HCG QUANT (REF LAB): hCG Quant: 38 m[IU]/mL

## 2023-12-31 ENCOUNTER — Other Ambulatory Visit

## 2023-12-31 ENCOUNTER — Encounter: Payer: Self-pay | Admitting: Certified Nurse Midwife

## 2023-12-31 DIAGNOSIS — O009 Unspecified ectopic pregnancy without intrauterine pregnancy: Secondary | ICD-10-CM | POA: Diagnosis not present

## 2023-12-31 NOTE — Addendum Note (Signed)
 Addended by: Kaelob Persky on: 12/31/2023 09:27 AM   Modules accepted: Orders

## 2023-12-31 NOTE — Addendum Note (Signed)
 Addended by: LOREE DERREK PARAS on: 12/31/2023 08:23 AM   Modules accepted: Orders

## 2024-01-01 ENCOUNTER — Encounter: Payer: Self-pay | Admitting: Family Medicine

## 2024-01-01 LAB — BETA HCG QUANT (REF LAB): hCG Quant: 18 m[IU]/mL

## 2024-01-02 ENCOUNTER — Ambulatory Visit: Payer: Self-pay | Admitting: Family Medicine

## 2024-01-07 ENCOUNTER — Other Ambulatory Visit: Payer: Self-pay | Admitting: *Deleted

## 2024-01-07 DIAGNOSIS — O009 Unspecified ectopic pregnancy without intrauterine pregnancy: Secondary | ICD-10-CM

## 2024-01-08 ENCOUNTER — Encounter: Payer: Self-pay | Admitting: Obstetrics and Gynecology

## 2024-01-08 ENCOUNTER — Other Ambulatory Visit

## 2024-01-08 DIAGNOSIS — O009 Unspecified ectopic pregnancy without intrauterine pregnancy: Secondary | ICD-10-CM | POA: Diagnosis not present

## 2024-01-09 LAB — BETA HCG QUANT (REF LAB): hCG Quant: 7 m[IU]/mL

## 2024-01-14 ENCOUNTER — Other Ambulatory Visit

## 2024-01-14 ENCOUNTER — Encounter: Payer: Self-pay | Admitting: Certified Nurse Midwife

## 2024-01-14 DIAGNOSIS — O009 Unspecified ectopic pregnancy without intrauterine pregnancy: Secondary | ICD-10-CM

## 2024-01-15 LAB — BETA HCG QUANT (REF LAB): hCG Quant: 4 m[IU]/mL

## 2024-01-18 ENCOUNTER — Ambulatory Visit: Payer: Self-pay | Admitting: Family Medicine

## 2024-01-28 ENCOUNTER — Other Ambulatory Visit

## 2024-01-28 ENCOUNTER — Encounter: Payer: Self-pay | Admitting: Family Medicine

## 2024-01-28 DIAGNOSIS — O009 Unspecified ectopic pregnancy without intrauterine pregnancy: Secondary | ICD-10-CM | POA: Diagnosis not present

## 2024-01-29 LAB — BETA HCG QUANT (REF LAB): hCG Quant: 2 m[IU]/mL

## 2024-01-30 ENCOUNTER — Ambulatory Visit: Payer: Self-pay | Admitting: Family Medicine

## 2024-01-30 DIAGNOSIS — E282 Polycystic ovarian syndrome: Secondary | ICD-10-CM

## 2024-02-24 MED ORDER — LETROZOLE 2.5 MG PO TABS: 5.0000 mg | ORAL_TABLET | Freq: Every day | ORAL | 3 refills | Status: AC

## 2024-03-19 ENCOUNTER — Encounter: Payer: Self-pay | Admitting: Family Medicine

## 2024-03-19 ENCOUNTER — Ambulatory Visit (INDEPENDENT_AMBULATORY_CARE_PROVIDER_SITE_OTHER): Payer: BC Managed Care – PPO | Admitting: Family Medicine

## 2024-03-19 VITALS — BP 120/80 | HR 87 | Temp 98.3°F | Ht 66.0 in | Wt 248.6 lb

## 2024-03-19 DIAGNOSIS — F419 Anxiety disorder, unspecified: Secondary | ICD-10-CM

## 2024-03-19 DIAGNOSIS — Z131 Encounter for screening for diabetes mellitus: Secondary | ICD-10-CM | POA: Diagnosis not present

## 2024-03-19 DIAGNOSIS — Z1159 Encounter for screening for other viral diseases: Secondary | ICD-10-CM

## 2024-03-19 DIAGNOSIS — I10 Essential (primary) hypertension: Secondary | ICD-10-CM

## 2024-03-19 DIAGNOSIS — E282 Polycystic ovarian syndrome: Secondary | ICD-10-CM

## 2024-03-19 DIAGNOSIS — F32A Depression, unspecified: Secondary | ICD-10-CM

## 2024-03-19 DIAGNOSIS — Z0001 Encounter for general adult medical examination with abnormal findings: Secondary | ICD-10-CM

## 2024-03-19 DIAGNOSIS — R0683 Snoring: Secondary | ICD-10-CM | POA: Insufficient documentation

## 2024-03-19 DIAGNOSIS — E2839 Other primary ovarian failure: Secondary | ICD-10-CM | POA: Diagnosis not present

## 2024-03-19 DIAGNOSIS — R5383 Other fatigue: Secondary | ICD-10-CM

## 2024-03-19 DIAGNOSIS — Z13220 Encounter for screening for lipoid disorders: Secondary | ICD-10-CM

## 2024-03-19 MED ORDER — ZEPBOUND 2.5 MG/0.5ML ~~LOC~~ SOAJ: 2.5000 mg | SUBCUTANEOUS | 0 refills | Status: AC

## 2024-03-19 NOTE — Assessment & Plan Note (Signed)
 Concern for OSA.  Will refer for sleep study evaluation.

## 2024-03-19 NOTE — Assessment & Plan Note (Signed)
 At goal today without meds.

## 2024-03-19 NOTE — Patient Instructions (Signed)
 It was very nice to see you today!  VISIT SUMMARY: Today, you had your annual physical exam. We discussed your weight management options, PCOS, sleep quality, and overall health. Blood work was ordered, and a home sleep study was recommended.  YOUR PLAN: MORBID OBESITY: Your BMI is over 30, and we discussed weight management options. -We submitted a prior authorization for Zepbound due to its efficacy and fewer side effects. If not covered, we will consider Wegovy or Saxenda. -We discussed potential side effects, including nausea.  POLYCYSTIC OVARIAN SYNDROME (PCOS): We discussed your PCOS symptoms and treatment options. -You will continue with your current letrozole  regimen, and we will monitor for mood changes. -We discussed the potential benefits of GLP-1 receptor agonists for PCOS.  SCREENING FOR DIABETES MELLITUS: We conducted routine screening for diabetes. -Blood work was ordered to screen for diabetes.  SNORING AND EVALUATION FOR SLEEP APNEA: You reported snoring and poor sleep quality, which may indicate sleep apnea. -We discussed the benefits of a sleep study for insurance purposes and health evaluation. -A home sleep study was ordered to evaluate for sleep apnea.  Return in about 1 year (around 03/19/2025) for Annual Physical.   Take care, Dr Kennyth  PLEASE NOTE:  If you had any lab tests, please let us  know if you have not heard back within a few days. You may see your results on mychart before we have a chance to review them but we will give you a call once they are reviewed by us .   If we ordered any referrals today, please let us  know if you have not heard from their office within the next week.   If you had any urgent prescriptions sent in today, please check with the pharmacy within an hour of our visit to make sure the prescription was transmitted appropriately.   Please try these tips to maintain a healthy lifestyle:  Eat at least 3 REAL meals and 1-2 snacks per  day.  Aim for no more than 5 hours between eating.  If you eat breakfast, please do so within one hour of getting up.   Each meal should contain half fruits/vegetables, one quarter protein, and one quarter carbs (no bigger than a computer mouse)  Cut down on sweet beverages. This includes juice, soda, and sweet tea.   Drink at least 1 glass of water  with each meal and aim for at least 8 glasses per day  Exercise at least 150 minutes every week.     Preventive Care 69-35 Years Old, Female Preventive care refers to lifestyle choices and visits with your health care provider that can promote health and wellness. Preventive care visits are also called wellness exams. What can I expect for my preventive care visit? Counseling During your preventive care visit, your health care provider may ask about your: Medical history, including: Past medical problems. Family medical history. Pregnancy history. Current health, including: Menstrual cycle. Method of birth control. Emotional well-being. Home life and relationship well-being. Sexual activity and sexual health. Lifestyle, including: Alcohol, nicotine or tobacco, and drug use. Access to firearms. Diet, exercise, and sleep habits. Work and work astronomer. Sunscreen use. Safety issues such as seatbelt and bike helmet use. Physical exam Your health care provider may check your: Height and weight. These may be used to calculate your BMI (body mass index). BMI is a measurement that tells if you are at a healthy weight. Waist circumference. This measures the distance around your waistline. This measurement also tells if you are  at a healthy weight and may help predict your risk of certain diseases, such as type 2 diabetes and high blood pressure. Heart rate and blood pressure. Body temperature. Skin for abnormal spots. What immunizations do I need?  Vaccines are usually given at various ages, according to a schedule. Your health care  provider will recommend vaccines for you based on your age, medical history, and lifestyle or other factors, such as travel or where you work. What tests do I need? Screening Your health care provider may recommend screening tests for certain conditions. This may include: Pelvic exam and Pap test. Lipid and cholesterol levels. Diabetes screening. This is done by checking your blood sugar (glucose) after you have not eaten for a while (fasting). Hepatitis B test. Hepatitis C test. HIV (human immunodeficiency virus) test. STI (sexually transmitted infection) testing, if you are at risk. BRCA-related cancer screening. This may be done if you have a family history of breast, ovarian, tubal, or peritoneal cancers. Talk with your health care provider about your test results, treatment options, and if necessary, the need for more tests. Follow these instructions at home: Eating and drinking  Eat a healthy diet that includes fresh fruits and vegetables, whole grains, lean protein, and low-fat dairy products. Take vitamin and mineral supplements as recommended by your health care provider. Do not drink alcohol if: Your health care provider tells you not to drink. You are pregnant, may be pregnant, or are planning to become pregnant. If you drink alcohol: Limit how much you have to 0-1 drink a day. Know how much alcohol is in your drink. In the [place] one drink equals one 12 oz bottle of beer (355 mL), one 5 oz glass of wine (148 mL), or one 1 oz glass of hard liquor (44 mL). Lifestyle Brush your teeth every morning and night with fluoride toothpaste. Floss one time each day. Exercise for at least 30 minutes 5 or more days each week. Do not use any products that contain nicotine or tobacco. These products include cigarettes, chewing tobacco, and vaping devices, such as e-cigarettes. If you need help quitting, ask your health care provider. Do not use drugs. If you are sexually active, practice  safe sex. Use a condom or other form of protection to prevent STIs. If you do not wish to become pregnant, use a form of birth control. If you plan to become pregnant, see your health care provider for a prepregnancy visit. Find healthy ways to manage stress, such as: Meditation, yoga, or listening to music. Journaling. Talking to a trusted person. Spending time with friends and family. Minimize exposure to UV radiation to reduce your risk of skin cancer. Safety Always wear your seat belt while driving or riding in a vehicle. Do not drive: If you have been drinking alcohol. Do not ride with someone who has been drinking. If you have been using any mind-altering substances or drugs. While texting. When you are tired or distracted. Wear a helmet and other protective equipment during sports activities. If you have firearms in your house, make sure you follow all gun safety procedures. Seek help if you have been physically or sexually abused. What's next? Go to your health care provider once a year for an annual wellness visit. Ask your health care provider how often you should have your eyes and teeth checked. Stay up to date on all vaccines. This information is not intended to replace advice given to you by your health care provider. Make sure you discuss  any questions you have with your health care provider. Document Revised: 08/09/2020 Document Reviewed: 08/09/2020 Elsevier Patient Education  2024 [employer].

## 2024-03-19 NOTE — Assessment & Plan Note (Signed)
 BMI 40.13 today with comorbidities including potential OSA, PCOS, hypertension.  She is working lifestyle interventions listed as difficulty weight loss.  She will be a good candidate for GLP agonist.  We discussed treatment options.  Will try Zepbound.  She is aware of potential side effects.  She will follow-up with us  in a few weeks via MyChart to let us  know how she is doing we can adjust the dose as needed.

## 2024-03-19 NOTE — Progress Notes (Signed)
 "  Chief Complaint:  Diego Delancey is a 34 y.o. female who presents today for her annual comprehensive physical exam.    Assessment/Plan:  Chronic Problems Addressed Today: Morbid obesity (HCC) BMI 40.13 today with comorbidities including potential OSA, PCOS, hypertension.  She is working lifestyle interventions listed as difficulty weight loss.  She will be a good candidate for GLP agonist.  We discussed treatment options.  Will try Zepbound .  She is aware of potential side effects.  She will follow-up with us  in a few weeks via MyChart to let us  know how she is doing we can adjust the dose as needed.  Hypertension At goal today without meds.  Snoring Concern for OSA.  Will refer for sleep study evaluation.  Anxiety and depression Stable off the Celexa .   Preventative Healthcare: Check labs.  Up-to-date on vaccines.  Will also obtain bone density scan as her gynecologist currently has her on letrozole  for ovulation induction.   Patient Counseling(The following topics were reviewed and/or handout was given):  -Nutrition: Stressed importance of moderation in sodium/caffeine intake, saturated fat and cholesterol, caloric balance, sufficient intake of fresh fruits, vegetables, and fiber.  -Stressed the importance of regular exercise.   -Substance Abuse: Discussed cessation/primary prevention of tobacco, alcohol, or other drug use; driving or other dangerous activities under the influence; availability of treatment for abuse.   -Injury prevention: Discussed safety belts, safety helmets, smoke detector, smoking near bedding or upholstery.   -Sexuality: Discussed sexually transmitted diseases, partner selection, use of condoms, avoidance of unin BMI 40 point tended pregnancy and contraceptive alternatives.   -Dental health: Discussed importance of regular tooth brushing, flossing, and dental visits.  -Health maintenance and immunizations reviewed. Please refer to Health maintenance  section.  Return to care in 1 year for next preventative visit.     Subjective:  HPI:  She has no acute complaints today. Patient is here today for her annual physical.  See assessment / plan for status of chronic conditions.  Discussed the use of AI scribe software for clinical note transcription with the patient, who gave verbal consent to proceed.  History of Present Illness Quetzalli Clos is a 34 year old female who presents for an annual physical exam.  She has a history of polycystic ovary syndrome (PCOS) and is exploring weight management options. She is considering medications such as Wegovy and Zepbound  for weight loss but is facing insurance coverage challenges. She has a BMI greater than 30, and insurance criteria for weight management medications are complex.  In September, she experienced an ectopic pregnancy, which required monitoring of her HCG levels for almost three months to ensure they were decreasing. She received a methotrexate  injection to manage the ectopic pregnancy and was advised to stop taking her vitamins and medications during that time. Since then, she has not resumed her previous medications except for letrozole . She has been on letrozole , completing three rounds of 2.5 mg and two rounds of 5 mg, and is about to start her third round of 5 mg. Letrozole  makes her feel 'very down,' which she has heard is common.  She has decided to focus on herself this year and has been making lifestyle changes, including walking during lunch breaks and eating cleaner with her fianc, resulting in an eight-pound weight loss.  She reports a history of snoring and poor sleep quality, and her fianc has undiagnosed sleep apnea, which sometimes disrupts her sleep. She has not been diagnosed with sleep apnea herself but is open to  further evaluation.  She stopped taking citalopram  after the ectopic pregnancy and feels more clear-minded without it. 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 She feels she is in a good place mentally, despite the emotional challenges of the past year.  She has a family history of osteoporosis, which is relevant given her current use of letrozole .      02/24/2023    1:16 PM  Depression screen PHQ 2/9  Decreased Interest 0  Down, Depressed, Hopeless 0  PHQ - 2 Score 0  Altered sleeping 2  Tired, decreased energy 2  Change in appetite 0  Feeling bad or failure about yourself  0  Trouble concentrating 1  Moving slowly or fidgety/restless 0  Suicidal thoughts 0  PHQ-9 Score 5   Difficult doing work/chores Somewhat difficult     Data saved with a previous flowsheet row definition    Health Maintenance Due  Topic Date Due   Bone Density Scan  Never done   Hepatitis C Screening  Never done     ROS: Per HPI, otherwise a complete review of systems was negative.   PMH:  The following were reviewed and entered/updated in epic: Past Medical History:  Diagnosis Date   Anxiety    Back pain    Depression    Hypertension    Joint pain    PCOS (polycystic ovarian syndrome)    SOB (shortness of breath)    Patient Active Problem List   Diagnosis Date Noted   Snoring 03/19/2024   PCOS (polycystic ovarian syndrome) 11/29/2022   Headache 11/29/2022   Morbid obesity (HCC) 11/29/2022   Anxiety and depression 10/07/2019   Hypertension    Past Surgical History:  Procedure Laterality Date   CESAREAN SECTION N/A 06/10/2018   Procedure: CESAREAN SECTION;  Surgeon: Kandis Devaughn Sayres, MD;  Location: [facility];  Service: Obstetrics;  Laterality: N/A;   FOOT SURGERY Right 2006    Family History  Problem Relation Age of Onset   Hypertension Mother    Anxiety disorder Mother    Hypertension Father    Hyperlipidemia Father    Heart disease Maternal Grandfather     Medications- reviewed and updated Current Outpatient Medications  Medication Sig Dispense Refill   letrozole  (FEMARA ) 2.5 MG tablet Take 2  tablets (5 mg total) by mouth daily. Take on days 3 to 7 following a spontaneous menses or progestin-induced bleed. 10 tablet 3   tirzepatide  (ZEPBOUND ) 2.5 MG/0.5ML Pen Inject 2.5 mg into the skin once a week. 2 mL 0   No current facility-administered medications for this visit.    Allergies-reviewed and updated Allergies[1]  Social History   Socioeconomic History   Marital status: Single    Spouse name: Not on file   Number of children: Not on file   Years of education: Not on file   Highest education level: Some college, no degree  Occupational History   Occupation: Market Researcher  Tobacco Use   Smoking status: Former    Current packs/day: 0.25    Types: Cigarettes   Smokeless tobacco: Former   Tobacco comments:    Only smokes one cigarette a day  Vaping Use   Vaping status: Some Days  Substance and Sexual Activity   Alcohol use: Not Currently   Drug use: No   Sexual activity: Yes    Partners: Male    Birth control/protection: None  Other Topics Concern   Not on file  Social History  Narrative   Nursing student at [employer]   Social Drivers of Health   Tobacco Use: Medium Risk (07/28/2023)   Patient History    Smoking Tobacco Use: Former    Smokeless Tobacco Use: Former    Passive Exposure: Not on Actuary Strain: Low Risk (12/10/2023)   Overall Financial Resource Strain (CARDIA)    Difficulty of Paying Living Expenses: Not very hard  Food Insecurity: No Food Insecurity (12/10/2023)   Epic    Worried About Programme Researcher, Broadcasting/film/video in the Last Year: Never true    Ran Out of Food in the Last Year: Never true  Transportation Needs: No Transportation Needs (12/10/2023)   Epic    Lack of Transportation (Medical): No    Lack of Transportation (Non-Medical): No  Physical Activity: Insufficiently Active (12/10/2023)   Exercise Vital Sign    Days of Exercise per Week: 3 days    Minutes of Exercise per Session: 20 min  Stress: Stress Concern Present  (12/10/2023)   [employer] of Occupational Health - Occupational Stress Questionnaire    Feeling of Stress: To some extent  Social Connections: Socially Integrated (12/10/2023)   Social Connection and Isolation Panel    Frequency of Communication with Friends and Family: More than three times a week    Frequency of Social Gatherings with Friends and Family: Once a week    Attends Religious Services: More than 4 times per year    Active Member of [employer] or Organizations: Yes    Attends Banker Meetings: 1 to 4 times per year    Marital Status: Living with partner  Depression (PHQ2-9): Medium Risk (02/24/2023)   Depression (PHQ2-9)    PHQ-2 Score: 5  Alcohol Screen: Low Risk (12/10/2023)   Alcohol Screen    Last Alcohol Screening Score (AUDIT): 1  Housing: Low Risk (12/10/2023)   Epic    Unable to Pay for Housing in the Last Year: No    Number of Times Moved in the Last Year: 0    Homeless in the Last Year: No  Utilities: Not on file  Health Literacy: Not on file        Objective:  Physical Exam: BP 120/80   Pulse 87   Temp 98.3 F (36.8 C) (Temporal)   Ht 5' 6 (1.676 m)   Wt 248 lb 9.6 oz (112.8 kg)   SpO2 97%   BMI 40.13 kg/m   Body mass index is 40.13 kg/m. Wt Readings from Last 3 Encounters:  03/19/24 248 lb 9.6 oz (112.8 kg)  12/10/23 249 lb 8 oz (113.2 kg)  12/07/23 249 lb 8 oz (113.2 kg)   Gen: NAD, resting comfortably HEENT: TMs normal bilaterally. OP clear. No thyromegaly noted.  CV: RRR with no murmurs appreciated Pulm: NWOB, CTAB with no crackles, wheezes, or rhonchi GI: Normal bowel sounds present. Soft, Nontender, Nondistended. MSK: no edema, cyanosis, or clubbing noted Skin: warm, dry Neuro: CN2-12 grossly intact. Strength 5/5 in upper and lower extremities. Reflexes symmetric and intact bilaterally.  Psych: Normal affect and thought content     Tyerra Loretto M. Kennyth, MD 03/19/2024 2:41 PM     [1] No Known Allergies  "

## 2024-03-19 NOTE — Assessment & Plan Note (Signed)
 Stable off the Celexa .

## 2024-03-20 LAB — HEMOGLOBIN A1C
Hgb A1c MFr Bld: 5.4 %
Mean Plasma Glucose: 108 mg/dL
eAG (mmol/L): 6 mmol/L

## 2024-03-20 LAB — COMPREHENSIVE METABOLIC PANEL WITH GFR
AG Ratio: 2 (calc) (ref 1.0–2.5)
ALT: 16 U/L (ref 6–29)
AST: 15 U/L (ref 10–30)
Albumin: 4.6 g/dL (ref 3.6–5.1)
Alkaline phosphatase (APISO): 67 U/L (ref 31–125)
BUN: 13 mg/dL (ref 7–25)
CO2: 25 mmol/L (ref 20–32)
Calcium: 9.7 mg/dL (ref 8.6–10.2)
Chloride: 104 mmol/L (ref 98–110)
Creat: 0.66 mg/dL (ref 0.50–0.97)
Globulin: 2.3 g/dL (ref 1.9–3.7)
Glucose, Bld: 78 mg/dL (ref 65–99)
Potassium: 4.3 mmol/L (ref 3.5–5.3)
Sodium: 139 mmol/L (ref 135–146)
Total Bilirubin: 0.3 mg/dL (ref 0.2–1.2)
Total Protein: 6.9 g/dL (ref 6.1–8.1)
eGFR: 117 mL/min/{1.73_m2}

## 2024-03-20 LAB — VITAMIN B12: Vitamin B-12: 346 pg/mL (ref 200–1100)

## 2024-03-20 LAB — VITAMIN D 25 HYDROXY (VIT D DEFICIENCY, FRACTURES): Vit D, 25-Hydroxy: 33 ng/mL (ref 30–100)

## 2024-03-20 LAB — TSH: TSH: 1.35 m[IU]/L

## 2024-03-20 LAB — LIPID PANEL
Cholesterol: 153 mg/dL
HDL: 45 mg/dL — ABNORMAL LOW
LDL Cholesterol (Calc): 91 mg/dL
Non-HDL Cholesterol (Calc): 108 mg/dL
Total CHOL/HDL Ratio: 3.4 (calc)
Triglycerides: 82 mg/dL

## 2024-03-20 LAB — CBC
HCT: 41.1 % (ref 35.9–46.0)
Hemoglobin: 13.4 g/dL (ref 11.7–15.5)
MCH: 27.7 pg (ref 27.0–33.0)
MCHC: 32.6 g/dL (ref 31.6–35.4)
MCV: 85.1 fL (ref 81.4–101.7)
MPV: 10.6 fL (ref 7.5–12.5)
Platelets: 332 10*3/uL (ref 140–400)
RBC: 4.83 Million/uL (ref 3.80–5.10)
RDW: 13.3 % (ref 11.0–15.0)
WBC: 6.7 10*3/uL (ref 3.8–10.8)

## 2024-03-20 LAB — HEPATITIS C ANTIBODY: Hepatitis C Ab: NONREACTIVE

## 2024-03-22 NOTE — Telephone Encounter (Signed)
 Please advise.

## 2024-03-23 ENCOUNTER — Other Ambulatory Visit: Payer: Self-pay | Admitting: *Deleted

## 2024-03-23 ENCOUNTER — Ambulatory Visit: Payer: Self-pay | Admitting: Family Medicine

## 2024-03-23 DIAGNOSIS — L600 Ingrowing nail: Secondary | ICD-10-CM

## 2024-03-23 NOTE — Telephone Encounter (Signed)
 Recommend referral to podiatry if she wants permanent removal.   Tami Swanson M. Kennyth, MD 03/23/2024 10:18 AM

## 2024-03-23 NOTE — Progress Notes (Signed)
 Good news! Labs are all stable.  We do not need to make any changes to treatment plan at this time. We can recheck everything in a year or so.

## 2024-03-26 ENCOUNTER — Ambulatory Visit: Admitting: Podiatry

## 2024-03-26 ENCOUNTER — Other Ambulatory Visit (HOSPITAL_COMMUNITY): Payer: Self-pay

## 2024-03-26 ENCOUNTER — Encounter: Payer: Self-pay | Admitting: Podiatry

## 2024-03-26 DIAGNOSIS — L600 Ingrowing nail: Secondary | ICD-10-CM

## 2024-03-26 DIAGNOSIS — L700 Acne vulgaris: Secondary | ICD-10-CM | POA: Insufficient documentation

## 2024-03-26 NOTE — Patient Instructions (Signed)

## 2024-03-29 NOTE — Progress Notes (Signed)
 Subjective:   Patient ID: Tami Swanson, female   DOB: 34 y.o.   MRN: [id number]   HPI Patient presents with a long-term chronic ingrown toenail deformities of the medial and lateral borders stating they have been very sore she has tried to trim them for years tried to soak them without relief.  Patient states she is here for permanent correction patient does not smoke likes to be active   Review of Systems  All other systems reviewed and are negative.       Objective:  Physical Exam Vitals and nursing note reviewed.  Constitutional:      Appearance: She is well-developed.  Pulmonary:     Effort: Pulmonary effort is normal.  Musculoskeletal:        General: Normal range of motion.  Skin:    General: Skin is warm.  Neurological:     Mental Status: She is alert.     Neurovascular status intact muscle strength adequate range of motion within normal limits with patient noted to have incurvated medial and lateral borders of the hallux bilateral that are very painful when pressed no active drainage no erythema associated with this with pain upon palpation to the areas.  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     Plan:  H&P reviewed recommended correction of deformity explained procedures risk patient wants surgery and understands risk and today I infiltrated each big toe 60 mg Xylocaine  Marcaine  mixture sterile prep done and using sterile instrumentation after she read and signed consent form I went ahead and removed the borders I exposed the roots of both medial and lateral bilateral hallux applied chemical consisting of phenol followed by alcohol lavage sterile dressing and instructed to leave on 24 hours take them off earlier if throbbing were to occur and encouraged to call with questions concerns which may arise

## 2025-03-25 ENCOUNTER — Encounter: Admitting: Family Medicine
